# Patient Record
Sex: Male | Born: 1940 | Race: White | Hispanic: No | Marital: Married | Smoking: Never smoker
Health system: Southern US, Community
[De-identification: ages and names within clinical notes are randomized; demographics above are authoritative.]

## PROBLEM LIST (undated history)

## (undated) DIAGNOSIS — T8859XA Other complications of anesthesia, initial encounter: Secondary | ICD-10-CM

## (undated) DIAGNOSIS — I10 Essential (primary) hypertension: Secondary | ICD-10-CM

## (undated) DIAGNOSIS — E119 Type 2 diabetes mellitus without complications: Secondary | ICD-10-CM

---

## 2007-01-18 ENCOUNTER — Ambulatory Visit (HOSPITAL_COMMUNITY): Admission: RE | Admit: 2007-01-18 | Discharge: 2007-01-18 | Payer: Self-pay | Admitting: Pulmonary Disease

## 2011-05-01 DIAGNOSIS — IMO0001 Reserved for inherently not codable concepts without codable children: Secondary | ICD-10-CM | POA: Diagnosis not present

## 2011-05-01 DIAGNOSIS — I10 Essential (primary) hypertension: Secondary | ICD-10-CM | POA: Diagnosis not present

## 2011-10-30 DIAGNOSIS — I10 Essential (primary) hypertension: Secondary | ICD-10-CM | POA: Diagnosis not present

## 2011-10-30 DIAGNOSIS — E109 Type 1 diabetes mellitus without complications: Secondary | ICD-10-CM | POA: Diagnosis not present

## 2011-10-30 DIAGNOSIS — E785 Hyperlipidemia, unspecified: Secondary | ICD-10-CM | POA: Diagnosis not present

## 2011-10-31 DIAGNOSIS — I10 Essential (primary) hypertension: Secondary | ICD-10-CM | POA: Diagnosis not present

## 2011-10-31 DIAGNOSIS — E785 Hyperlipidemia, unspecified: Secondary | ICD-10-CM | POA: Diagnosis not present

## 2011-10-31 DIAGNOSIS — E109 Type 1 diabetes mellitus without complications: Secondary | ICD-10-CM | POA: Diagnosis not present

## 2011-12-26 DIAGNOSIS — H40039 Anatomical narrow angle, unspecified eye: Secondary | ICD-10-CM | POA: Diagnosis not present

## 2011-12-26 DIAGNOSIS — E119 Type 2 diabetes mellitus without complications: Secondary | ICD-10-CM | POA: Diagnosis not present

## 2011-12-26 DIAGNOSIS — H2510 Age-related nuclear cataract, unspecified eye: Secondary | ICD-10-CM | POA: Diagnosis not present

## 2012-01-14 DIAGNOSIS — H40039 Anatomical narrow angle, unspecified eye: Secondary | ICD-10-CM | POA: Diagnosis not present

## 2012-01-27 DIAGNOSIS — H40039 Anatomical narrow angle, unspecified eye: Secondary | ICD-10-CM | POA: Diagnosis not present

## 2012-01-27 DIAGNOSIS — H18419 Arcus senilis, unspecified eye: Secondary | ICD-10-CM | POA: Diagnosis not present

## 2012-01-27 DIAGNOSIS — H2510 Age-related nuclear cataract, unspecified eye: Secondary | ICD-10-CM | POA: Diagnosis not present

## 2012-02-06 DIAGNOSIS — H40039 Anatomical narrow angle, unspecified eye: Secondary | ICD-10-CM | POA: Diagnosis not present

## 2012-02-06 DIAGNOSIS — H402290 Chronic angle-closure glaucoma, unspecified eye, stage unspecified: Secondary | ICD-10-CM | POA: Diagnosis not present

## 2012-02-13 DIAGNOSIS — H40039 Anatomical narrow angle, unspecified eye: Secondary | ICD-10-CM | POA: Diagnosis not present

## 2012-02-20 DIAGNOSIS — H40039 Anatomical narrow angle, unspecified eye: Secondary | ICD-10-CM | POA: Diagnosis not present

## 2012-03-10 DIAGNOSIS — H4020X0 Unspecified primary angle-closure glaucoma, stage unspecified: Secondary | ICD-10-CM | POA: Diagnosis not present

## 2012-03-19 DIAGNOSIS — H402290 Chronic angle-closure glaucoma, unspecified eye, stage unspecified: Secondary | ICD-10-CM | POA: Diagnosis not present

## 2012-04-28 DIAGNOSIS — I10 Essential (primary) hypertension: Secondary | ICD-10-CM | POA: Diagnosis not present

## 2012-04-28 DIAGNOSIS — E109 Type 1 diabetes mellitus without complications: Secondary | ICD-10-CM | POA: Diagnosis not present

## 2012-06-11 DIAGNOSIS — H40039 Anatomical narrow angle, unspecified eye: Secondary | ICD-10-CM | POA: Diagnosis not present

## 2012-09-15 DIAGNOSIS — H40039 Anatomical narrow angle, unspecified eye: Secondary | ICD-10-CM | POA: Diagnosis not present

## 2012-10-29 DIAGNOSIS — I10 Essential (primary) hypertension: Secondary | ICD-10-CM | POA: Diagnosis not present

## 2012-10-29 DIAGNOSIS — M79609 Pain in unspecified limb: Secondary | ICD-10-CM | POA: Diagnosis not present

## 2012-10-29 DIAGNOSIS — E109 Type 1 diabetes mellitus without complications: Secondary | ICD-10-CM | POA: Diagnosis not present

## 2012-11-01 DIAGNOSIS — Z125 Encounter for screening for malignant neoplasm of prostate: Secondary | ICD-10-CM | POA: Diagnosis not present

## 2012-11-01 DIAGNOSIS — I10 Essential (primary) hypertension: Secondary | ICD-10-CM | POA: Diagnosis not present

## 2012-11-01 DIAGNOSIS — E109 Type 1 diabetes mellitus without complications: Secondary | ICD-10-CM | POA: Diagnosis not present

## 2012-12-17 DIAGNOSIS — H402290 Chronic angle-closure glaucoma, unspecified eye, stage unspecified: Secondary | ICD-10-CM | POA: Diagnosis not present

## 2013-04-15 DIAGNOSIS — H2510 Age-related nuclear cataract, unspecified eye: Secondary | ICD-10-CM | POA: Diagnosis not present

## 2013-04-15 DIAGNOSIS — H402290 Chronic angle-closure glaucoma, unspecified eye, stage unspecified: Secondary | ICD-10-CM | POA: Diagnosis not present

## 2013-04-28 DIAGNOSIS — I10 Essential (primary) hypertension: Secondary | ICD-10-CM | POA: Diagnosis not present

## 2013-04-28 DIAGNOSIS — E109 Type 1 diabetes mellitus without complications: Secondary | ICD-10-CM | POA: Diagnosis not present

## 2013-06-28 DIAGNOSIS — T24009A Burn of unspecified degree of unspecified site of unspecified lower limb, except ankle and foot, initial encounter: Secondary | ICD-10-CM | POA: Diagnosis not present

## 2013-10-27 DIAGNOSIS — H402290 Chronic angle-closure glaucoma, unspecified eye, stage unspecified: Secondary | ICD-10-CM | POA: Diagnosis not present

## 2013-10-27 DIAGNOSIS — I10 Essential (primary) hypertension: Secondary | ICD-10-CM | POA: Diagnosis not present

## 2013-10-27 DIAGNOSIS — E119 Type 2 diabetes mellitus without complications: Secondary | ICD-10-CM | POA: Diagnosis not present

## 2013-11-02 DIAGNOSIS — E119 Type 2 diabetes mellitus without complications: Secondary | ICD-10-CM | POA: Diagnosis not present

## 2013-11-02 DIAGNOSIS — I10 Essential (primary) hypertension: Secondary | ICD-10-CM | POA: Diagnosis not present

## 2013-11-02 DIAGNOSIS — Z125 Encounter for screening for malignant neoplasm of prostate: Secondary | ICD-10-CM | POA: Diagnosis not present

## 2014-03-16 DIAGNOSIS — H1045 Other chronic allergic conjunctivitis: Secondary | ICD-10-CM | POA: Diagnosis not present

## 2014-04-26 DIAGNOSIS — D210 Benign neoplasm of connective and other soft tissue of head, face and neck: Secondary | ICD-10-CM | POA: Diagnosis not present

## 2014-04-26 DIAGNOSIS — H40033 Anatomical narrow angle, bilateral: Secondary | ICD-10-CM | POA: Diagnosis not present

## 2014-04-27 DIAGNOSIS — I10 Essential (primary) hypertension: Secondary | ICD-10-CM | POA: Diagnosis not present

## 2014-04-27 DIAGNOSIS — E119 Type 2 diabetes mellitus without complications: Secondary | ICD-10-CM | POA: Diagnosis not present

## 2014-10-25 DIAGNOSIS — H40033 Anatomical narrow angle, bilateral: Secondary | ICD-10-CM | POA: Diagnosis not present

## 2014-10-26 DIAGNOSIS — E119 Type 2 diabetes mellitus without complications: Secondary | ICD-10-CM | POA: Diagnosis not present

## 2014-10-26 DIAGNOSIS — I10 Essential (primary) hypertension: Secondary | ICD-10-CM | POA: Diagnosis not present

## 2014-10-27 DIAGNOSIS — E119 Type 2 diabetes mellitus without complications: Secondary | ICD-10-CM | POA: Diagnosis not present

## 2014-10-27 DIAGNOSIS — Z125 Encounter for screening for malignant neoplasm of prostate: Secondary | ICD-10-CM | POA: Diagnosis not present

## 2014-10-27 DIAGNOSIS — I10 Essential (primary) hypertension: Secondary | ICD-10-CM | POA: Diagnosis not present

## 2015-04-26 DIAGNOSIS — E119 Type 2 diabetes mellitus without complications: Secondary | ICD-10-CM | POA: Diagnosis not present

## 2015-04-26 DIAGNOSIS — I10 Essential (primary) hypertension: Secondary | ICD-10-CM | POA: Diagnosis not present

## 2015-05-08 DIAGNOSIS — H40033 Anatomical narrow angle, bilateral: Secondary | ICD-10-CM | POA: Diagnosis not present

## 2015-08-18 DIAGNOSIS — S0501XA Injury of conjunctiva and corneal abrasion without foreign body, right eye, initial encounter: Secondary | ICD-10-CM | POA: Diagnosis not present

## 2015-08-20 DIAGNOSIS — S0501XS Injury of conjunctiva and corneal abrasion without foreign body, right eye, sequela: Secondary | ICD-10-CM | POA: Diagnosis not present

## 2015-10-03 ENCOUNTER — Other Ambulatory Visit (HOSPITAL_COMMUNITY): Payer: Self-pay | Admitting: Pulmonary Disease

## 2015-10-03 ENCOUNTER — Ambulatory Visit (HOSPITAL_COMMUNITY)
Admission: RE | Admit: 2015-10-03 | Discharge: 2015-10-03 | Disposition: A | Discharge status: Home / Self Care | Payer: Medicare Other | Source: Ambulatory Visit | Attending: Pulmonary Disease | Admitting: Pulmonary Disease

## 2015-10-03 DIAGNOSIS — R509 Fever, unspecified: Secondary | ICD-10-CM | POA: Diagnosis not present

## 2015-10-03 DIAGNOSIS — I10 Essential (primary) hypertension: Secondary | ICD-10-CM | POA: Diagnosis not present

## 2015-10-03 DIAGNOSIS — J189 Pneumonia, unspecified organism: Secondary | ICD-10-CM | POA: Diagnosis not present

## 2015-10-03 DIAGNOSIS — R634 Abnormal weight loss: Secondary | ICD-10-CM | POA: Diagnosis not present

## 2015-10-03 DIAGNOSIS — E119 Type 2 diabetes mellitus without complications: Secondary | ICD-10-CM | POA: Diagnosis not present

## 2015-10-03 DIAGNOSIS — R05 Cough: Secondary | ICD-10-CM | POA: Diagnosis not present

## 2015-10-03 DIAGNOSIS — N390 Urinary tract infection, site not specified: Secondary | ICD-10-CM | POA: Diagnosis not present

## 2015-10-26 DIAGNOSIS — R509 Fever, unspecified: Secondary | ICD-10-CM | POA: Diagnosis not present

## 2015-10-26 DIAGNOSIS — R634 Abnormal weight loss: Secondary | ICD-10-CM | POA: Diagnosis not present

## 2015-10-26 DIAGNOSIS — E119 Type 2 diabetes mellitus without complications: Secondary | ICD-10-CM | POA: Diagnosis not present

## 2015-10-26 DIAGNOSIS — Z125 Encounter for screening for malignant neoplasm of prostate: Secondary | ICD-10-CM | POA: Diagnosis not present

## 2015-10-26 DIAGNOSIS — I10 Essential (primary) hypertension: Secondary | ICD-10-CM | POA: Diagnosis not present

## 2015-10-26 DIAGNOSIS — M6281 Muscle weakness (generalized): Secondary | ICD-10-CM | POA: Diagnosis not present

## 2015-11-08 DIAGNOSIS — H40031 Anatomical narrow angle, right eye: Secondary | ICD-10-CM | POA: Diagnosis not present

## 2015-11-08 DIAGNOSIS — H40032 Anatomical narrow angle, left eye: Secondary | ICD-10-CM | POA: Diagnosis not present

## 2016-04-28 DIAGNOSIS — E119 Type 2 diabetes mellitus without complications: Secondary | ICD-10-CM | POA: Diagnosis not present

## 2016-04-28 DIAGNOSIS — D649 Anemia, unspecified: Secondary | ICD-10-CM | POA: Diagnosis not present

## 2016-04-28 DIAGNOSIS — I10 Essential (primary) hypertension: Secondary | ICD-10-CM | POA: Diagnosis not present

## 2016-07-21 DIAGNOSIS — H11153 Pinguecula, bilateral: Secondary | ICD-10-CM | POA: Diagnosis not present

## 2016-10-27 DIAGNOSIS — R634 Abnormal weight loss: Secondary | ICD-10-CM | POA: Diagnosis not present

## 2016-10-27 DIAGNOSIS — D649 Anemia, unspecified: Secondary | ICD-10-CM | POA: Diagnosis not present

## 2016-10-27 DIAGNOSIS — E1165 Type 2 diabetes mellitus with hyperglycemia: Secondary | ICD-10-CM | POA: Diagnosis not present

## 2016-10-27 DIAGNOSIS — Z125 Encounter for screening for malignant neoplasm of prostate: Secondary | ICD-10-CM | POA: Diagnosis not present

## 2016-10-27 DIAGNOSIS — I10 Essential (primary) hypertension: Secondary | ICD-10-CM | POA: Diagnosis not present

## 2016-10-27 DIAGNOSIS — Z0000 Encounter for general adult medical examination without abnormal findings: Secondary | ICD-10-CM | POA: Diagnosis not present

## 2016-10-27 DIAGNOSIS — M6281 Muscle weakness (generalized): Secondary | ICD-10-CM | POA: Diagnosis not present

## 2016-10-29 DIAGNOSIS — I10 Essential (primary) hypertension: Secondary | ICD-10-CM | POA: Diagnosis not present

## 2016-10-29 DIAGNOSIS — E119 Type 2 diabetes mellitus without complications: Secondary | ICD-10-CM | POA: Diagnosis not present

## 2016-11-14 DIAGNOSIS — H40033 Anatomical narrow angle, bilateral: Secondary | ICD-10-CM | POA: Diagnosis not present

## 2017-02-17 DIAGNOSIS — H40033 Anatomical narrow angle, bilateral: Secondary | ICD-10-CM | POA: Diagnosis not present

## 2017-04-27 DIAGNOSIS — I10 Essential (primary) hypertension: Secondary | ICD-10-CM | POA: Diagnosis not present

## 2017-04-27 DIAGNOSIS — E119 Type 2 diabetes mellitus without complications: Secondary | ICD-10-CM | POA: Diagnosis not present

## 2017-06-18 DIAGNOSIS — H40033 Anatomical narrow angle, bilateral: Secondary | ICD-10-CM | POA: Diagnosis not present

## 2017-10-22 DIAGNOSIS — D649 Anemia, unspecified: Secondary | ICD-10-CM | POA: Diagnosis not present

## 2017-10-22 DIAGNOSIS — Z0000 Encounter for general adult medical examination without abnormal findings: Secondary | ICD-10-CM | POA: Diagnosis not present

## 2017-10-22 DIAGNOSIS — M6281 Muscle weakness (generalized): Secondary | ICD-10-CM | POA: Diagnosis not present

## 2017-10-22 DIAGNOSIS — I10 Essential (primary) hypertension: Secondary | ICD-10-CM | POA: Diagnosis not present

## 2017-10-22 DIAGNOSIS — R634 Abnormal weight loss: Secondary | ICD-10-CM | POA: Diagnosis not present

## 2017-10-22 DIAGNOSIS — E1165 Type 2 diabetes mellitus with hyperglycemia: Secondary | ICD-10-CM | POA: Diagnosis not present

## 2017-10-22 DIAGNOSIS — Z125 Encounter for screening for malignant neoplasm of prostate: Secondary | ICD-10-CM | POA: Diagnosis not present

## 2017-11-05 DIAGNOSIS — Z0000 Encounter for general adult medical examination without abnormal findings: Secondary | ICD-10-CM | POA: Diagnosis not present

## 2017-12-18 DIAGNOSIS — H40033 Anatomical narrow angle, bilateral: Secondary | ICD-10-CM | POA: Diagnosis not present

## 2018-06-25 DIAGNOSIS — H40033 Anatomical narrow angle, bilateral: Secondary | ICD-10-CM | POA: Diagnosis not present

## 2018-06-25 DIAGNOSIS — H2513 Age-related nuclear cataract, bilateral: Secondary | ICD-10-CM | POA: Diagnosis not present

## 2019-02-18 ENCOUNTER — Ambulatory Visit (INDEPENDENT_AMBULATORY_CARE_PROVIDER_SITE_OTHER): Payer: Medicare Other

## 2019-02-18 ENCOUNTER — Ambulatory Visit
Admission: EM | Admit: 2019-02-18 | Discharge: 2019-02-18 | Disposition: A | Discharge status: Home / Self Care | Payer: Medicare Other | Attending: Emergency Medicine | Admitting: Emergency Medicine

## 2019-02-18 DIAGNOSIS — S92351A Displaced fracture of fifth metatarsal bone, right foot, initial encounter for closed fracture: Secondary | ICD-10-CM

## 2019-02-18 DIAGNOSIS — S92355A Nondisplaced fracture of fifth metatarsal bone, left foot, initial encounter for closed fracture: Secondary | ICD-10-CM | POA: Diagnosis not present

## 2019-02-18 HISTORY — DX: Essential (primary) hypertension: I10

## 2019-02-18 MED ORDER — TRAMADOL HCL 50 MG PO TABS: 50.0000 mg | ORAL_TABLET | Freq: Two times a day (BID) | ORAL | 0 refills | Status: DC | PRN

## 2019-02-18 NOTE — ED Provider Notes (Signed)
[name]   MW:[id number] 02/18/19 Arrival Time: 1209  CC: RT foot pain  SUBJECTIVE: History from: patient. Sean Mcfarland is a 79 y.o. male complains of right foot pain that began 1 day ago.  Symptoms began after having a fall when he got out of bed.  Localizes the pain to the top and outside of foot.  Describes the pain as intermittent and achy in character.  Has tried OTC medications without relief.  Symptoms are made worse with walking.  Denies similar symptoms in the past.  Complains of associated swelling.  Denies fever, chills, erythema, ecchymosis, weakness, numbness and tingling.   ROS: As per HPI.  All other pertinent ROS negative.     Past Medical History:  Diagnosis Date  . Hypertension    History reviewed. No pertinent surgical history. No Known Allergies No current facility-administered medications on file prior to encounter.   No current outpatient medications on file prior to encounter.   Social History   Socioeconomic History  . Marital status: Married    Spouse name: Not on file  . Number of children: Not on file  . Years of education: Not on file  . Highest education level: Not on file  Occupational History  . Not on file  Tobacco Use  . Smoking status: Never Smoker  . Smokeless tobacco: Never Used  Substance and Sexual Activity  . Alcohol use: Never  . Drug use: Not on file  . Sexual activity: Not on file  Other Topics Concern  . Not on file  Social History Narrative  . Not on file   Social Determinants of Health   Financial Resource Strain:   . Difficulty of Paying Living Expenses: Not on file  Food Insecurity:   . Worried About Charity fundraiser in the Last Year: Not on file  . Ran Out of Food in the Last Year: Not on file  Transportation Needs:   . Lack of Transportation (Medical): Not on file  . Lack of Transportation (Non-Medical): Not on file  Physical Activity:   . Days of Exercise per Week: Not on file  . Minutes of  Exercise per Session: Not on file  Stress:   . Feeling of Stress : Not on file  Social Connections:   . Frequency of Communication with Friends and Family: Not on file  . Frequency of Social Gatherings with Friends and Family: Not on file  . Attends Religious Services: Not on file  . Active Member of Clubs or Organizations: Not on file  . Attends Archivist Meetings: Not on file  . Marital Status: Not on file  Intimate Partner Violence:   . Fear of Current or Ex-Partner: Not on file  . Emotionally Abused: Not on file  . Physically Abused: Not on file  . Sexually Abused: Not on file   Family History  Problem Relation Age of Onset  . Healthy Mother   . Healthy Father     OBJECTIVE:  Vitals:   02/18/19 1317  BP: 133/77  Pulse: 68  Resp: 17  Temp: 98.3 F (36.8 C)  TempSrc: Oral  SpO2: 98%    General appearance: ALERT; in no acute distress.  Head: NCAT Lungs: Normal respiratory effort CV: posterior tibialis pulse 2+. Cap refill < 2 seconds Musculoskeletal: RT foot Inspection: Swelling diffuse about plantar aspect of foot Palpation: TTP over lateral fifth MT ROM: FROM active and passive Strength: 5/5 dorsiflexion, 5/5 plantar flexion Skin: warm and dry Neurologic:  Ambulates with minimal difficulty; Sensation intact about the lower extremities Psychological: alert and cooperative; normal mood and affect  DIAGNOSTIC STUDIES:  DG Foot Complete Right  Result Date: 02/18/2019 CLINICAL DATA:  79 year old male status post fall yesterday with continued lateral foot pain and swelling. EXAM: RIGHT FOOT COMPLETE - 3+ VIEW COMPARISON:  None. FINDINGS: Mild pes planus. Tarsal bones appear intact and normally aligned. Accessory ossicle adjacent to the cuboid. Oblique fracture through the distal 5th metatarsal shaft with mild impaction and medial displacement. This appears extra-articular. The 5th MTP remains aligned. Other metatarsals and phalanges appear intact. IMPRESSION:  Oblique fracture through the distal 5th metatarsal shaft with mild impaction and medial displacement. Electronically Signed   By: Genevie Ann M.D.   On: 02/18/2019 13:42    X-rays positive for 5th MT fracture.  I have reviewed the x-rays myself and the radiologist interpretation. I am in agreement with the radiologist interpretation.    ASSESSMENT & PLAN:  1. Nondisplaced fracture of fifth metatarsal bone, left foot, initial encounter for closed fracture     Meds ordered this encounter  Medications  . traMADol (ULTRAM) 50 MG tablet    Sig: Take 1 tablet (50 mg total) by mouth every 12 (twelve) hours as needed.    Dispense:  10 tablet    Refill:  0    Order Specific Question:   Supervising Provider    Answer:   Raylene Everts [id number]    Continue conservative management of rest, ice, and elevation Declines post-op shoe and crutches Walking boot placed Alternate ibuprofen and/or tylenol for pain and swelling Tramadol for severe break-through pain. DO NOT TAKE prior to driving or operating heavy machinery Follow up with orthopedist for further evaluation and managment Return or go to the ER if you have any new or worsening symptoms (fever, chills, chest pain, redness, swelling, bruising, worsening symptoms despite treatment, etc...)   Reviewed expectations re: course of current medical issues. Questions answered. Outlined signs and symptoms indicating need for more acute intervention. Patient verbalized understanding. After Visit Summary given.    Lestine Box, PA-C 02/18/19 1400

## 2019-02-18 NOTE — Discharge Instructions (Addendum)
Continue conservative management of rest, ice, and elevation Declines post-op shoe and crutches Walking boot placed Alternate ibuprofen and/or tylenol for pain and swelling Tramadol for severe break-through pain. DO NOT TAKE prior to driving or operating heavy machinery Follow up with orthopedist for further evaluation and managment Return or go to the ER if you have any new or worsening symptoms (fever, chills, chest pain, redness, swelling, bruising, worsening symptoms despite treatment, etc...)

## 2019-02-18 NOTE — ED Triage Notes (Signed)
Pt states he fell yesterday and has been having pain and swelling in his right foot. CMS intact. Pt is ambulatory.

## 2019-02-21 ENCOUNTER — Ambulatory Visit (INDEPENDENT_AMBULATORY_CARE_PROVIDER_SITE_OTHER): Payer: Medicare Other | Admitting: Orthopedic Surgery

## 2019-02-21 ENCOUNTER — Other Ambulatory Visit: Payer: Self-pay

## 2019-02-21 ENCOUNTER — Telehealth: Payer: Self-pay

## 2019-02-21 ENCOUNTER — Ambulatory Visit: Payer: Medicare Other | Admitting: Orthopedic Surgery

## 2019-02-21 VITALS — BP 146/89 | HR 70 | Temp 97.0°F | Ht 65.0 in

## 2019-02-21 DIAGNOSIS — S92351A Displaced fracture of fifth metatarsal bone, right foot, initial encounter for closed fracture: Secondary | ICD-10-CM

## 2019-02-21 NOTE — Telephone Encounter (Signed)
He is here being seen by Dr. Aline Brochure as we speak.

## 2019-02-21 NOTE — Telephone Encounter (Signed)
Patient's son-in-law Windell [place]) called wanting to get his father-in-law in to see Dr. Aline Brochure today. He was not in a very pleasant mood, had already contacted [place] office and was referred over to [place] office. Patient is scheduled to see Dr. Marlou Sa this afternoon at 2:15 pm. Mr. Silverio Lay was upset first because [facility] had listed [place] office first, so he called there first instead of calling our office and then second he had to wait on hold for a few minutes since I was on another line.  I explained to him that Dr. Aline Brochure is only here this morning and that I was sorry he called the [place] office first, but that [place] and our office work together. He stated that if he had to go back and forth to [facility] he would be switching his father-in-law's care to our office, because it was not convenient for him. I apologized several times to him for all of the inconvenience he went through.

## 2019-02-21 NOTE — Telephone Encounter (Signed)
Thank you for speaking with him.  Do we need to do anything at this point?  Or is he going to be seeing Dr Marlou Sa today?

## 2019-02-21 NOTE — Progress Notes (Signed)
Chief Complaint  Patient presents with  . Foot Injury    Right foot fracture, DOI 02-17-19.    Right foot pain.  79 year old male says he got up to go to the bathroom and injured his right foot presented to the ER on 8 January with a date of injury of January 7 complaining of dorsal lateral pain of the right foot.  His ER records were reviewed and indicate that he has a right fifth metatarsal comminuted fracture near the distal metatarsal diaphyseal junction.  He was treated with a cam walker.  History of pain is controlled fairly well with tramadol and a cam walker    Review of Systems  Constitutional: Negative for chills and fever.  Skin: Negative.   Neurological: Negative for tingling.   Past Medical History:  Diagnosis Date  . Hypertension     BP (!) 146/89   Pulse 70   Temp (!) 97 F (36.1 C)   Ht 5\' 5"  (1.651 m)   He is awake alert and oriented x3 his mood and affect are normal His general appearance is normal his body frame is medium  His right foot is tender on the dorsal lateral aspect near the distal aspect with mild swelling skin is intact he can move his toes normally has no foot atrophy  He is ambulating with a walker boot  Outside x-ray interpretation AP lateral and oblique right foot Comminuted fracture distal fifth metatarsal near the metaphyseal diaphyseal junction  Recommended treatment cam walker  Weight-bear as tolerated 6 weeks  Recommend he continue his 10 mg hydrocodone daily which she takes for chronic pain  Encounter Diagnosis  Name Primary?  . Closed fracture of fifth metatarsal bone of right foot, initial encounter Yes   Summation acute uncomplicated injury assessment requiring independent interpretation of x-ray outside film

## 2019-03-16 ENCOUNTER — Ambulatory Visit: Payer: Medicare Other | Admitting: Family Medicine

## 2019-04-04 ENCOUNTER — Other Ambulatory Visit: Payer: Self-pay

## 2019-04-04 ENCOUNTER — Ambulatory Visit: Payer: Medicare Other

## 2019-04-04 ENCOUNTER — Ambulatory Visit (INDEPENDENT_AMBULATORY_CARE_PROVIDER_SITE_OTHER): Payer: Medicare Other | Admitting: Orthopedic Surgery

## 2019-04-04 DIAGNOSIS — S92351D Displaced fracture of fifth metatarsal bone, right foot, subsequent encounter for fracture with routine healing: Secondary | ICD-10-CM

## 2019-04-04 NOTE — Patient Instructions (Addendum)
Walk in the house with the boot off, if it doesn't hurt you can remove the boot for good.  If it hurts wear boot 2 more weeks

## 2019-04-04 NOTE — Progress Notes (Signed)
Chief Complaint  Patient presents with  . Foot Pain    R/ doing ok   Recheck fracture fifth metatarsal  Right foot pain.  79 year old male says he got up to go to the bathroom and injured his right foot presented to the ER on 8 January with a date of injury of January 7 complaining of dorsal lateral pain of the right foot.  His ER records were reviewed and indicate that he has a right fifth metatarsal comminuted fracture near the distal metatarsal diaphyseal junction.  He was treated with a cam walker.  History of pain is controlled fairly well with tramadol and a cam walker   X-ray shows no displacement but on the oblique we don't see any callus  Clinical exam is normal  Walk in the house with the boot off, if it doesn't hurt you can remove the boot for good.  If it hurts wear boot 2 more weeks   Encounter Diagnosis  Name Primary?  . Closed fracture of fifth metatarsal bone of right foot with routine healing, subsequent encounter 02/17/19 Yes

## 2019-04-13 ENCOUNTER — Telehealth: Payer: Self-pay | Admitting: Orthopedic Surgery

## 2019-04-13 NOTE — Telephone Encounter (Signed)
Says he has been walking without the boot and is feeling good.  He will call us back if he has any pain, swelling or problems

## 2019-05-30 DIAGNOSIS — I10 Essential (primary) hypertension: Secondary | ICD-10-CM | POA: Diagnosis not present

## 2019-05-30 DIAGNOSIS — Z0189 Encounter for other specified special examinations: Secondary | ICD-10-CM | POA: Diagnosis not present

## 2019-05-30 DIAGNOSIS — E1165 Type 2 diabetes mellitus with hyperglycemia: Secondary | ICD-10-CM | POA: Diagnosis not present

## 2019-06-15 DIAGNOSIS — Z0189 Encounter for other specified special examinations: Secondary | ICD-10-CM | POA: Diagnosis not present

## 2019-06-15 DIAGNOSIS — Z712 Person consulting for explanation of examination or test findings: Secondary | ICD-10-CM | POA: Diagnosis not present

## 2019-06-15 DIAGNOSIS — E1165 Type 2 diabetes mellitus with hyperglycemia: Secondary | ICD-10-CM | POA: Diagnosis not present

## 2019-06-15 DIAGNOSIS — I10 Essential (primary) hypertension: Secondary | ICD-10-CM | POA: Diagnosis not present

## 2019-06-21 DIAGNOSIS — E1165 Type 2 diabetes mellitus with hyperglycemia: Secondary | ICD-10-CM | POA: Diagnosis not present

## 2019-06-21 DIAGNOSIS — I10 Essential (primary) hypertension: Secondary | ICD-10-CM | POA: Diagnosis not present

## 2019-09-30 DIAGNOSIS — I10 Essential (primary) hypertension: Secondary | ICD-10-CM | POA: Diagnosis not present

## 2019-09-30 DIAGNOSIS — Z712 Person consulting for explanation of examination or test findings: Secondary | ICD-10-CM | POA: Diagnosis not present

## 2019-09-30 DIAGNOSIS — Z0189 Encounter for other specified special examinations: Secondary | ICD-10-CM | POA: Diagnosis not present

## 2019-09-30 DIAGNOSIS — R7301 Impaired fasting glucose: Secondary | ICD-10-CM | POA: Diagnosis not present

## 2019-10-04 ENCOUNTER — Other Ambulatory Visit (HOSPITAL_COMMUNITY): Payer: Self-pay | Admitting: Internal Medicine

## 2019-10-04 ENCOUNTER — Other Ambulatory Visit: Payer: Self-pay | Admitting: Internal Medicine

## 2019-10-04 DIAGNOSIS — R945 Abnormal results of liver function studies: Secondary | ICD-10-CM

## 2019-10-04 DIAGNOSIS — I10 Essential (primary) hypertension: Secondary | ICD-10-CM | POA: Diagnosis not present

## 2019-10-04 DIAGNOSIS — E1165 Type 2 diabetes mellitus with hyperglycemia: Secondary | ICD-10-CM | POA: Diagnosis not present

## 2019-10-04 DIAGNOSIS — E785 Hyperlipidemia, unspecified: Secondary | ICD-10-CM | POA: Diagnosis not present

## 2019-10-11 ENCOUNTER — Ambulatory Visit (HOSPITAL_COMMUNITY)
Admission: RE | Admit: 2019-10-11 | Discharge: 2019-10-11 | Disposition: A | Discharge status: Home / Self Care | Payer: Medicare Other | Source: Ambulatory Visit | Attending: Internal Medicine | Admitting: Internal Medicine

## 2019-10-11 ENCOUNTER — Other Ambulatory Visit: Payer: Self-pay

## 2019-10-11 DIAGNOSIS — R945 Abnormal results of liver function studies: Secondary | ICD-10-CM | POA: Insufficient documentation

## 2019-10-11 DIAGNOSIS — K8020 Calculus of gallbladder without cholecystitis without obstruction: Secondary | ICD-10-CM | POA: Diagnosis not present

## 2019-10-11 DIAGNOSIS — N281 Cyst of kidney, acquired: Secondary | ICD-10-CM | POA: Diagnosis not present

## 2019-10-11 DIAGNOSIS — I700 Atherosclerosis of aorta: Secondary | ICD-10-CM | POA: Diagnosis not present

## 2019-10-11 DIAGNOSIS — K7689 Other specified diseases of liver: Secondary | ICD-10-CM | POA: Diagnosis not present

## 2019-10-14 ENCOUNTER — Other Ambulatory Visit: Payer: Self-pay | Admitting: Internal Medicine

## 2019-10-14 ENCOUNTER — Other Ambulatory Visit (HOSPITAL_COMMUNITY): Payer: Self-pay | Admitting: Internal Medicine

## 2019-10-14 DIAGNOSIS — K7689 Other specified diseases of liver: Secondary | ICD-10-CM

## 2019-10-28 ENCOUNTER — Ambulatory Visit (HOSPITAL_COMMUNITY)
Admission: RE | Admit: 2019-10-28 | Discharge: 2019-10-28 | Disposition: A | Discharge status: Home / Self Care | Payer: Medicare Other | Source: Ambulatory Visit | Attending: Internal Medicine | Admitting: Internal Medicine

## 2019-10-28 ENCOUNTER — Other Ambulatory Visit: Payer: Self-pay

## 2019-10-28 DIAGNOSIS — N281 Cyst of kidney, acquired: Secondary | ICD-10-CM | POA: Diagnosis not present

## 2019-10-28 DIAGNOSIS — K7689 Other specified diseases of liver: Secondary | ICD-10-CM | POA: Insufficient documentation

## 2019-10-28 MED ORDER — GADOBUTROL 1 MMOL/ML IV SOLN
7.0000 mL | Freq: Once | INTRAVENOUS | Status: AC | PRN
  Administered 2019-10-28: 7 mL via INTRAVENOUS

## 2019-11-04 DIAGNOSIS — Z0189 Encounter for other specified special examinations: Secondary | ICD-10-CM | POA: Diagnosis not present

## 2019-11-04 DIAGNOSIS — Z712 Person consulting for explanation of examination or test findings: Secondary | ICD-10-CM | POA: Diagnosis not present

## 2019-11-04 DIAGNOSIS — R945 Abnormal results of liver function studies: Secondary | ICD-10-CM | POA: Diagnosis not present

## 2019-11-08 DIAGNOSIS — R945 Abnormal results of liver function studies: Secondary | ICD-10-CM | POA: Diagnosis not present

## 2019-11-08 DIAGNOSIS — E785 Hyperlipidemia, unspecified: Secondary | ICD-10-CM | POA: Diagnosis not present

## 2019-11-08 DIAGNOSIS — E1165 Type 2 diabetes mellitus with hyperglycemia: Secondary | ICD-10-CM | POA: Diagnosis not present

## 2019-11-08 DIAGNOSIS — D649 Anemia, unspecified: Secondary | ICD-10-CM | POA: Diagnosis not present

## 2019-11-08 DIAGNOSIS — I10 Essential (primary) hypertension: Secondary | ICD-10-CM | POA: Diagnosis not present

## 2019-11-22 ENCOUNTER — Encounter: Payer: Self-pay | Admitting: Internal Medicine

## 2019-11-29 ENCOUNTER — Ambulatory Visit (INDEPENDENT_AMBULATORY_CARE_PROVIDER_SITE_OTHER): Payer: Medicare Other | Admitting: Gastroenterology

## 2019-11-29 ENCOUNTER — Other Ambulatory Visit: Payer: Self-pay

## 2019-11-29 ENCOUNTER — Encounter: Payer: Self-pay | Admitting: Gastroenterology

## 2019-11-29 DIAGNOSIS — D649 Anemia, unspecified: Secondary | ICD-10-CM | POA: Diagnosis not present

## 2019-11-29 DIAGNOSIS — R932 Abnormal findings on diagnostic imaging of liver and biliary tract: Secondary | ICD-10-CM

## 2019-11-29 DIAGNOSIS — R945 Abnormal results of liver function studies: Secondary | ICD-10-CM | POA: Insufficient documentation

## 2019-11-29 DIAGNOSIS — R7989 Other specified abnormal findings of blood chemistry: Secondary | ICD-10-CM

## 2019-11-29 DIAGNOSIS — Z79899 Other long term (current) drug therapy: Secondary | ICD-10-CM | POA: Diagnosis not present

## 2019-11-29 NOTE — Patient Instructions (Signed)
1. Please go for labs today. We will contact you with results as available. I will also look at the MRI with the radiologist as mentioned.  2. If you develop fever, nausea/vomiting, abdominal pain, please call us.
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
## 2019-11-30 ENCOUNTER — Telehealth: Payer: Self-pay | Admitting: Gastroenterology

## 2019-11-30 LAB — CBC WITH DIFFERENTIAL/PLATELET
Basophils Absolute: 0.1 10*3/uL (ref 0.0–0.2)
Basos: 1 %
EOS (ABSOLUTE): 0.1 10*3/uL (ref 0.0–0.4)
Eos: 1 %
Hematocrit: 33.9 % — ABNORMAL LOW (ref 37.5–51.0)
Hemoglobin: 11.4 g/dL — ABNORMAL LOW (ref 13.0–17.7)
Immature Grans (Abs): 0 10*3/uL (ref 0.0–0.1)
Immature Granulocytes: 0 %
Lymphocytes Absolute: 1.7 10*3/uL (ref 0.7–3.1)
Lymphs: 21 %
MCH: 30.5 pg (ref 26.6–33.0)
MCHC: 33.6 g/dL (ref 31.5–35.7)
MCV: 91 fL (ref 79–97)
Monocytes Absolute: 0.6 10*3/uL (ref 0.1–0.9)
Monocytes: 8 %
Neutrophils Absolute: 5.5 10*3/uL (ref 1.4–7.0)
Neutrophils: 69 %
Platelets: 366 10*3/uL (ref 150–450)
RBC: 3.74 x10E6/uL — ABNORMAL LOW (ref 4.14–5.80)
RDW: 12.4 % (ref 11.6–15.4)
WBC: 8 10*3/uL (ref 3.4–10.8)

## 2019-11-30 LAB — IRON,TIBC AND FERRITIN PANEL
Ferritin: 980 ng/mL — ABNORMAL HIGH (ref 30–400)
Iron Saturation: 47 % (ref 15–55)
Iron: 82 ug/dL (ref 38–169)
Total Iron Binding Capacity: 175 ug/dL — ABNORMAL LOW (ref 250–450)
UIBC: 93 ug/dL — ABNORMAL LOW (ref 111–343)

## 2019-11-30 LAB — COMPREHENSIVE METABOLIC PANEL
ALT: 287 IU/L — ABNORMAL HIGH (ref 0–44)
AST: 282 IU/L — ABNORMAL HIGH (ref 0–40)
Albumin/Globulin Ratio: 1.2 (ref 1.2–2.2)
Albumin: 3.2 g/dL — ABNORMAL LOW (ref 3.7–4.7)
Alkaline Phosphatase: 1385 IU/L (ref 44–121)
BUN/Creatinine Ratio: 27 — ABNORMAL HIGH (ref 10–24)
BUN: 17 mg/dL (ref 8–27)
Bilirubin Total: 6.8 mg/dL — ABNORMAL HIGH (ref 0.0–1.2)
CO2: 24 mmol/L (ref 20–29)
Calcium: 8.5 mg/dL — ABNORMAL LOW (ref 8.6–10.2)
Chloride: 101 mmol/L (ref 96–106)
Creatinine, Ser: 0.64 mg/dL — ABNORMAL LOW (ref 0.76–1.27)
GFR calc Af Amer: 107 mL/min/{1.73_m2} (ref >59)
GFR calc non Af Amer: 92 mL/min/{1.73_m2} (ref >59)
Globulin, Total: 2.7 g/dL (ref 1.5–4.5)
Glucose: 142 mg/dL — ABNORMAL HIGH (ref 65–99)
Potassium: 3.9 mmol/L (ref 3.5–5.2)
Sodium: 137 mmol/L (ref 134–144)
Total Protein: 5.9 g/dL — ABNORMAL LOW (ref 6.0–8.5)

## 2019-11-30 LAB — MITOCHONDRIAL/SMOOTH MUSCLE AB PNL
Mitochondrial Ab: <20 Units (ref 0.0–20.0)
Smooth Muscle Ab: 8 Units (ref 0–19)

## 2019-11-30 LAB — HEPATITIS C ANTIBODY: Hep C Virus Ab: <0.1 s/co ratio (ref 0.0–0.9)

## 2019-11-30 LAB — HEPATITIS B SURFACE ANTIGEN: Hepatitis B Surface Ag: NEGATIVE

## 2019-11-30 LAB — IGG, IGA, IGM
IgA/Immunoglobulin A, Serum: 500 mg/dL — ABNORMAL HIGH (ref 61–437)
IgG (Immunoglobin G), Serum: 1040 mg/dL (ref 603–1613)
IgM (Immunoglobulin M), Srm: 79 mg/dL (ref 15–143)

## 2019-11-30 LAB — AFP TUMOR MARKER: AFP, Serum, Tumor Marker: 2 ng/mL (ref 0.0–8.3)

## 2019-11-30 LAB — HEPATITIS A ANTIBODY, TOTAL: hep A Total Ab: NEGATIVE

## 2019-11-30 LAB — ANA: Anti Nuclear Antibody (ANA): POSITIVE — AB

## 2019-11-30 LAB — HEPATITIS B SURFACE ANTIBODY,QUALITATIVE: Hep B Surface Ab, Qual: NONREACTIVE

## 2019-11-30 NOTE — Telephone Encounter (Signed)
Spoke to Dr. Laural Golden and patient today. His LFTs remain significantly elevated with bilirubin over 6, AP over 1300, AST/ALT in the 200 range. Radiologist looked at MRI Abd done few weeks ago and states the CBD is dilated and has oblong filling defect 2cm in length. Patient afebrile. Had first episode of abd pain last night, occurred after eating biscuit from fast food restaurant. No pain this morning. ER precautions provided. Advised low fat diet for now.  Patient is aware that Dr. Olevia Perches office will be calling to schedule ERCP.   FYI Dr. Abbey Chatters.

## 2019-12-01 ENCOUNTER — Encounter (HOSPITAL_COMMUNITY)
Admission: RE | Admit: 2019-12-01 | Discharge: 2019-12-01 | Disposition: A | Discharge status: Home / Self Care | Payer: Medicare Other | Source: Ambulatory Visit | Attending: Internal Medicine | Admitting: Internal Medicine

## 2019-12-01 ENCOUNTER — Encounter (HOSPITAL_COMMUNITY): Payer: Self-pay

## 2019-12-01 ENCOUNTER — Telehealth: Payer: Self-pay | Admitting: Internal Medicine

## 2019-12-01 ENCOUNTER — Other Ambulatory Visit: Payer: Self-pay

## 2019-12-01 ENCOUNTER — Other Ambulatory Visit (HOSPITAL_COMMUNITY)
Admission: RE | Admit: 2019-12-01 | Discharge: 2019-12-01 | Disposition: A | Discharge status: Home / Self Care | Payer: Medicare Other | Source: Ambulatory Visit | Attending: Internal Medicine | Admitting: Internal Medicine

## 2019-12-01 ENCOUNTER — Other Ambulatory Visit (INDEPENDENT_AMBULATORY_CARE_PROVIDER_SITE_OTHER): Payer: Self-pay

## 2019-12-01 DIAGNOSIS — R945 Abnormal results of liver function studies: Secondary | ICD-10-CM

## 2019-12-01 DIAGNOSIS — Z20822 Contact with and (suspected) exposure to covid-19: Secondary | ICD-10-CM | POA: Diagnosis not present

## 2019-12-01 DIAGNOSIS — Z01812 Encounter for preprocedural laboratory examination: Secondary | ICD-10-CM | POA: Diagnosis not present

## 2019-12-01 DIAGNOSIS — R7989 Other specified abnormal findings of blood chemistry: Secondary | ICD-10-CM

## 2019-12-01 HISTORY — DX: Type 2 diabetes mellitus without complications: E11.9

## 2019-12-01 LAB — SARS CORONAVIRUS 2 (TAT 6-24 HRS): SARS Coronavirus 2: NEGATIVE

## 2019-12-01 NOTE — Telephone Encounter (Signed)
Pt was seen in office recently and called this morning saying that Neil Crouch, PA wanted him to call and speak with her. Please advise. [phone]

## 2019-12-01 NOTE — Telephone Encounter (Signed)
Patient sch'd for ERCP tomorrow, arrive at [facility] at 1030, covid test today at 1030 - patient and wife aware

## 2019-12-01 NOTE — Telephone Encounter (Signed)
PCP office called to say that an addendum was done to the MRI and wanted to let us be aware and asked for someone to call PCP office back at [phone] to let them know the plan of care for the patient.

## 2019-12-01 NOTE — Telephone Encounter (Signed)
I think the issue has been resolved. I told him to call this morning if he had not heard from Dr. Olevia Perches about scheduling ERCP but looks like he has now been scheduled for tomorrow. Let me know if he needs anything else.

## 2019-12-01 NOTE — Telephone Encounter (Signed)
Noted  

## 2019-12-01 NOTE — Telephone Encounter (Signed)
Lmom, waiting on a return call.  

## 2019-12-01 NOTE — Telephone Encounter (Signed)
Noted. Thanks.

## 2019-12-01 NOTE — Telephone Encounter (Signed)
Called office, it opens back up at 2 pm. Will call back.

## 2019-12-01 NOTE — Telephone Encounter (Signed)
Sean Mcfarland, pt is returning a call from you this morning.

## 2019-12-02 ENCOUNTER — Ambulatory Visit (HOSPITAL_COMMUNITY): Payer: Medicare Other | Admitting: Anesthesiology

## 2019-12-02 ENCOUNTER — Ambulatory Visit (HOSPITAL_COMMUNITY): Payer: Medicare Other

## 2019-12-02 ENCOUNTER — Ambulatory Visit (HOSPITAL_COMMUNITY)
Admission: RE | Admit: 2019-12-02 | Discharge: 2019-12-02 | Disposition: A | Discharge status: Home / Self Care | Payer: Medicare Other | Attending: Internal Medicine | Admitting: Internal Medicine

## 2019-12-02 ENCOUNTER — Encounter (HOSPITAL_COMMUNITY)
Admission: RE | Disposition: A | Discharge status: Home / Self Care | Payer: Self-pay | Source: Home / Self Care | Attending: Internal Medicine

## 2019-12-02 ENCOUNTER — Encounter (HOSPITAL_COMMUNITY): Payer: Self-pay | Admitting: Internal Medicine

## 2019-12-02 DIAGNOSIS — E119 Type 2 diabetes mellitus without complications: Secondary | ICD-10-CM | POA: Insufficient documentation

## 2019-12-02 DIAGNOSIS — K8050 Calculus of bile duct without cholangitis or cholecystitis without obstruction: Secondary | ICD-10-CM | POA: Diagnosis not present

## 2019-12-02 DIAGNOSIS — R945 Abnormal results of liver function studies: Secondary | ICD-10-CM

## 2019-12-02 DIAGNOSIS — K8051 Calculus of bile duct without cholangitis or cholecystitis with obstruction: Secondary | ICD-10-CM | POA: Insufficient documentation

## 2019-12-02 DIAGNOSIS — Z79899 Other long term (current) drug therapy: Secondary | ICD-10-CM | POA: Insufficient documentation

## 2019-12-02 DIAGNOSIS — I10 Essential (primary) hypertension: Secondary | ICD-10-CM | POA: Diagnosis not present

## 2019-12-02 DIAGNOSIS — K8020 Calculus of gallbladder without cholecystitis without obstruction: Secondary | ICD-10-CM

## 2019-12-02 DIAGNOSIS — R7989 Other specified abnormal findings of blood chemistry: Secondary | ICD-10-CM

## 2019-12-02 HISTORY — PX: ERCP: SHX5425

## 2019-12-02 HISTORY — PX: SPHINCTEROTOMY: SHX5544

## 2019-12-02 HISTORY — PX: REMOVAL OF STONES: SHX5545

## 2019-12-02 LAB — HEPATIC FUNCTION PANEL
ALT: 268 U/L — ABNORMAL HIGH (ref 0–44)
AST: 202 U/L — ABNORMAL HIGH (ref 15–41)
Albumin: 2.5 g/dL — ABNORMAL LOW (ref 3.5–5.0)
Alkaline Phosphatase: 900 U/L — ABNORMAL HIGH (ref 38–126)
Bilirubin, Direct: 4.6 mg/dL — ABNORMAL HIGH (ref 0.0–0.2)
Indirect Bilirubin: 3.6 mg/dL — ABNORMAL HIGH (ref 0.3–0.9)
Total Bilirubin: 8.2 mg/dL — ABNORMAL HIGH (ref 0.3–1.2)
Total Protein: 5.9 g/dL — ABNORMAL LOW (ref 6.5–8.1)

## 2019-12-02 LAB — GLUCOSE, CAPILLARY
Glucose-Capillary: 82 mg/dL (ref 70–99)
Glucose-Capillary: 101 mg/dL — ABNORMAL HIGH (ref 70–99)

## 2019-12-02 LAB — PROTIME-INR
INR: 1.3 — ABNORMAL HIGH (ref 0.8–1.2)
Prothrombin Time: 15.2 seconds (ref 11.4–15.2)

## 2019-12-02 SURGERY — ERCP, WITH INTERVENTION IF INDICATED
Anesthesia: General

## 2019-12-02 MED ORDER — ROCURONIUM BROMIDE 10 MG/ML (PF) SYRINGE
PREFILLED_SYRINGE | INTRAVENOUS | Status: AC
  Filled 2019-12-02: qty 10

## 2019-12-02 MED ORDER — LIDOCAINE HCL (CARDIAC) PF 100 MG/5ML IV SOSY
PREFILLED_SYRINGE | INTRAVENOUS | Status: DC | PRN
  Administered 2019-12-02: 80 mg via INTRAVENOUS

## 2019-12-02 MED ORDER — SODIUM CHLORIDE 0.9 % IV SOLN
INTRAVENOUS | Status: AC
  Filled 2019-12-02: qty 100

## 2019-12-02 MED ORDER — LACTATED RINGERS IV SOLN: INTRAVENOUS | Status: DC

## 2019-12-02 MED ORDER — FENTANYL CITRATE (PF) 100 MCG/2ML IJ SOLN
INTRAMUSCULAR | Status: AC
  Filled 2019-12-02: qty 2

## 2019-12-02 MED ORDER — FENTANYL CITRATE (PF) 100 MCG/2ML IJ SOLN
INTRAMUSCULAR | Status: DC | PRN
  Administered 2019-12-02: 25 ug via INTRAVENOUS
  Administered 2019-12-02: 100 ug via INTRAVENOUS

## 2019-12-02 MED ORDER — ONDANSETRON HCL 4 MG/2ML IJ SOLN: 4.0000 mg | Freq: Once | INTRAMUSCULAR | Status: DC | PRN

## 2019-12-02 MED ORDER — PROPOFOL 10 MG/ML IV BOLUS
INTRAVENOUS | Status: AC
  Filled 2019-12-02: qty 20

## 2019-12-02 MED ORDER — DEXTROSE-NACL 5-0.45 % IV SOLN: INTRAVENOUS | Status: DC

## 2019-12-02 MED ORDER — ONDANSETRON HCL 4 MG/2ML IJ SOLN
INTRAMUSCULAR | Status: AC
  Filled 2019-12-02: qty 2

## 2019-12-02 MED ORDER — SUGAMMADEX SODIUM 500 MG/5ML IV SOLN
INTRAVENOUS | Status: DC | PRN
  Administered 2019-12-02: 200 mg via INTRAVENOUS

## 2019-12-02 MED ORDER — ONDANSETRON HCL 4 MG/2ML IJ SOLN
INTRAMUSCULAR | Status: DC | PRN
  Administered 2019-12-02: 4 mg via INTRAVENOUS

## 2019-12-02 MED ORDER — CHLORHEXIDINE GLUCONATE CLOTH 2 % EX PADS: 6.0000 | MEDICATED_PAD | Freq: Once | CUTANEOUS | Status: DC

## 2019-12-02 MED ORDER — GLUCAGON HCL RDNA (DIAGNOSTIC) 1 MG IJ SOLR
INTRAMUSCULAR | Status: AC
  Filled 2019-12-02: qty 2

## 2019-12-02 MED ORDER — WATER FOR IRRIGATION, STERILE IR SOLN
Status: DC | PRN
  Administered 2019-12-02: 500 mL via SURGICAL_CAVITY

## 2019-12-02 MED ORDER — SODIUM CHLORIDE 0.9 % IV SOLN
INTRAVENOUS | Status: DC | PRN
  Administered 2019-12-02: 30 mL

## 2019-12-02 MED ORDER — ROCURONIUM BROMIDE 100 MG/10ML IV SOLN
INTRAVENOUS | Status: DC | PRN
  Administered 2019-12-02: 50 mg via INTRAVENOUS

## 2019-12-02 MED ORDER — FENTANYL CITRATE (PF) 100 MCG/2ML IJ SOLN: 25.0000 ug | INTRAMUSCULAR | Status: DC | PRN

## 2019-12-02 MED ORDER — SUCCINYLCHOLINE CHLORIDE 200 MG/10ML IV SOSY
PREFILLED_SYRINGE | INTRAVENOUS | Status: DC | PRN
  Administered 2019-12-02: 100 mg via INTRAVENOUS

## 2019-12-02 MED ORDER — SUCCINYLCHOLINE CHLORIDE 200 MG/10ML IV SOSY
PREFILLED_SYRINGE | INTRAVENOUS | Status: AC
  Filled 2019-12-02: qty 10

## 2019-12-02 MED ORDER — LIDOCAINE 2% (20 MG/ML) 5 ML SYRINGE
INTRAMUSCULAR | Status: AC
  Filled 2019-12-02: qty 5

## 2019-12-02 MED ORDER — PROPOFOL 10 MG/ML IV BOLUS
INTRAVENOUS | Status: DC | PRN
  Administered 2019-12-02: 150 mg via INTRAVENOUS

## 2019-12-02 MED ORDER — CEFAZOLIN SODIUM-DEXTROSE 2-4 GM/100ML-% IV SOLN
2.0000 g | INTRAVENOUS | Status: AC
  Administered 2019-12-02: 2 g via INTRAVENOUS
  Filled 2019-12-02: qty 100

## 2019-12-02 MED ORDER — PHENYLEPHRINE 40 MCG/ML (10ML) SYRINGE FOR IV PUSH (FOR BLOOD PRESSURE SUPPORT)
PREFILLED_SYRINGE | INTRAVENOUS | Status: AC
  Filled 2019-12-02: qty 10

## 2019-12-02 MED ORDER — PHENYLEPHRINE HCL (PRESSORS) 10 MG/ML IV SOLN
INTRAVENOUS | Status: DC | PRN
  Administered 2019-12-02: 80 ug via INTRAVENOUS
  Administered 2019-12-02: 40 ug via INTRAVENOUS

## 2019-12-02 NOTE — Op Note (Signed)
[facility] Patient Name: Sean Mcfarland Procedure Date: 12/02/2019 12:44 PM MRN: [id number] Date of Birth: 05-08-1940 Attending MD: Hildred Laser , MD CSN: [id number] Age: 79 Admit Type: Outpatient Procedure:                ERCP Indications:              Common bile duct stone(s) Providers:                Hildred Laser, MD, Otis Peak B. Sharon Seller, RN, Dereck Leep, Technician, Randa Spike,                            Technician Referring MD:             Neil Crouch, PAC and Delphina Cahill, MD Medicines:                General Anesthesia Complications:            No immediate complications. Estimated Blood Loss:     Estimated blood loss: none. Procedure:                Pre-Anesthesia Assessment:                           - Prior to the procedure, a History and Physical                            was performed, and patient medications and                            allergies were reviewed. The patient's tolerance of                            previous anesthesia was also reviewed. The risks                            and benefits of the procedure and the sedation                            options and risks were discussed with the patient.                            All questions were answered, and informed consent                            was obtained. Prior Anticoagulants: The patient has                            taken no previous anticoagulant or antiplatelet                            agents. ASA Grade Assessment: II - A patient with  mild systemic disease. After reviewing the risks                            and benefits, the patient was deemed in                            satisfactory condition to undergo the procedure.                           After obtaining informed consent, the scope was                            passed under direct vision. Throughout the                            procedure, the patient's  blood pressure, pulse, and                            oxygen saturations were monitored continuously. The                            [id number]-Q180V ([id number]) scope was introduced through                            the mouth, and used to inject contrast into and                            used to inject contrast into the bile duct. Ampulla                            of Vater was normal. CBD easily cannulated using                            Hydratome.CBD and CHD dilated to 11 to 12 mm. Large                            filling defect noted distally. Biliary                            sphincterotomy performed. Dormia basket passed                            through the bile duct twice with removal of debris                            and small fragments. Large date shaped stone was                            removed using stone balloon extractor. The stone                            was then removed using a Roth net and acute scope.  The patient tolerated the procedure well. Scope In: 1:39:53 PM Scope Out: 2:13:34 PM Total Procedure Duration: 0 hours 33 minutes 41 seconds  Findings:      The scout film was normal.      Dilated CBD and CHD with large filling defect consistent with stone.      Biliary sphincterotomy performed.      Large stone was removed with stone balloon extractor. Impression:               Dilated CBD and CHD with single stone removed with                            balloon stone extractor. Moderate Sedation:      Per Anesthesia Care Recommendation:           No aspirin or NSAIDs for 72 hours.                           Clear liquids today. Resume usual diet in a.m.                           Resume usual medications as before.                           Follow-up with Ms. Neil Crouch, PA-C at [facility] in 2                            weeks. Procedure Code(s):        --- Professional ---                           [phone], Endoscopic retrograde                             cholangiopancreatography (ERCP); diagnostic,                            including collection of specimen(s) by brushing or                            washing, when performed (separate procedure) Diagnosis Code(s):        --- Professional ---                           K80.50, Calculus of bile duct without cholangitis                            or cholecystitis without obstruction CPT copyright 2019 American Medical Association. All rights reserved. The codes documented in this report are preliminary and upon coder review may  be revised to meet current compliance requirements. Hildred Laser, MD Hildred Laser, MD 12/02/2019 2:36:05 PM This report has been signed electronically. Number of Addenda: 0

## 2019-12-02 NOTE — Progress Notes (Signed)
Brief ERCP note.  Small sliding hiatal hernia. Normal ampulla of Vater. Dilated CBD and CHD with large filling defect distally. Biliary sphincterotomy performed. Few small fragments removed with Dormia basket.  Stone had to be removed with stone balloon extractor. Stone was then removed using Q scope and Jabier Mutton net. Patient tolerated procedure well.

## 2019-12-02 NOTE — Discharge Instructions (Signed)
No aspirin or NSAIDs for 72 hours. Clear liquids this afternoon.  Can have sandwich this evening if you are hungry and not nauseated Resume usual medications as before. Resume usual diet starting on 12/03/2019. No driving for 24 hours.       Endoscopic Retrograde Cholangiopancreatogram, Care After This sheet gives you information about how to care for yourself after your procedure. Your health care provider may also give you more specific instructions. If you have problems or questions, contact your health care provider. What can I expect after the procedure? After the procedure, it is common to have:  Soreness in your throat.  Nausea.  Bloating.  Dizziness.  Tiredness (fatigue). Follow these instructions at home:   Take over-the-counter and prescription medicines only as told by your health care provider.  Do not drive for 24 hours if you were given a medicine to help you relax (sedative) during your procedure. Have someone stay with you for 24 hours after the procedure.  Return to your normal activities as told by your health care provider. Ask your health care provider what activities are safe for you.  Return to eating what you normally do as soon as you feel well enough or as told by your health care provider.  Keep all follow-up visits as told by your health care provider. This is important. Contact a health care provider if:  You have pain in your abdomen that does not get better with medicine.  You develop signs of infection, such as: ? Chills. ? Feeling unwell. Get help right away if:  You have difficulty swallowing.  You have worsening pain in your throat, chest, or abdomen.  You vomit bright red blood or a substance that looks like coffee grounds.  You have bloody or very black stools.  You have a fever.  You have a sudden increase in swelling (bloating) in your abdomen. Summary  After the procedure, it is common to feel tired and to have some  discomfort in your throat.  Contact your health care provider if you have signs of infection--such as chills or feeling unwell--or if you have pain that does not improve with medicine.  Get help right away if you have trouble swallowing, worsening pain, bloody or black vomit, bloody or black stools, a fever, or increased swelling in your abdomen.  Keep all follow-up visits as told by your health care provider. This is important. This information is not intended to replace advice given to you by your health care provider. Make sure you discuss any questions you have with your health care provider. Document Revised: 01/09/2017 Document Reviewed: 12/17/2015 Elsevier Patient Education  2020 [place] Anesthesia, Adult, Care After This sheet gives you information about how to care for yourself after your procedure. Your health care provider may also give you more specific instructions. If you have problems or questions, contact your health care provider. What can I expect after the procedure? After the procedure, the following side effects are common:  Pain or discomfort at the IV site.  Nausea.  Vomiting.  Sore throat.  Trouble concentrating.  Feeling cold or chills.  Weak or tired.  Sleepiness and fatigue.  Soreness and body aches. These side effects can affect parts of the body that were not involved in surgery. Follow these instructions at home:  For at least 24 hours after the procedure:  Have a responsible adult stay with you. It is important to have someone help care for you until  you are awake and alert.  Rest as needed.  Do not: ? Participate in activities in which you could fall or become injured. ? Drive. ? Use heavy machinery. ? Drink alcohol. ? Take sleeping pills or medicines that cause drowsiness. ? Make important decisions or sign legal documents. ? Take care of children on your own. Eating and drinking  Follow any instructions from  your health care provider about eating or drinking restrictions.  When you feel hungry, start by eating small amounts of foods that are soft and easy to digest (bland), such as toast. Gradually return to your regular diet.  Drink enough fluid to keep your urine pale yellow.  If you vomit, rehydrate by drinking water, juice, or clear broth. General instructions  If you have sleep apnea, surgery and certain medicines can increase your risk for breathing problems. Follow instructions from your health care provider about wearing your sleep device: ? Anytime you are sleeping, including during daytime naps. ? While taking prescription pain medicines, sleeping medicines, or medicines that make you drowsy.  Return to your normal activities as told by your health care provider. Ask your health care provider what activities are safe for you.  Take over-the-counter and prescription medicines only as told by your health care provider.  If you smoke, do not smoke without supervision.  Keep all follow-up visits as told by your health care provider. This is important. Contact a health care provider if:  You have nausea or vomiting that does not get better with medicine.  You cannot eat or drink without vomiting.  You have pain that does not get better with medicine.  You are unable to pass urine.  You develop a skin rash.  You have a fever.  You have redness around your IV site that gets worse. Get help right away if:  You have difficulty breathing.  You have chest pain.  You have blood in your urine or stool, or you vomit blood. Summary  After the procedure, it is common to have a sore throat or nausea. It is also common to feel tired.  Have a responsible adult stay with you for the first 24 hours after general anesthesia. It is important to have someone help care for you until you are awake and alert.  When you feel hungry, start by eating small amounts of foods that are soft and  easy to digest (bland), such as toast. Gradually return to your regular diet.  Drink enough fluid to keep your urine pale yellow.  Return to your normal activities as told by your health care provider. Ask your health care provider what activities are safe for you. This information is not intended to replace advice given to you by your health care provider. Make sure you discuss any questions you have with your health care provider. Document Revised: 01/30/2017 Document Reviewed: 09/12/2016 Elsevier Patient Education  [place] Liquid Diet, Adult A clear liquid diet is a diet that includes only liquids and semi-liquids that you can see through. You do not eat any food on this diet. Most people need to follow this diet for only a short time. You may need to follow a clear liquid diet if:  You have a problem right before or after you have surgery.  You did not eat food for a long time.  You had any of these: ? Feeling sick to your stomach (nausea). ? Throwing up (vomiting). ? Passing a watery stool (diarrhea).  You are going to have an exam to look at parts of your digestive system.  You are going to have bowel surgery. The goals of this diet are:  To rest the stomach.  To help you clear the digestive system before an exam.  To make sure that there is enough fluid in your body.  To make sure you get some energy.  To help you get back to eating like you used to. What are tips for following this plan?  A clear liquid is a liquid or semi-liquid that you can see through when you hold it up to a light. An example of this is gelatin.  This diet does not give you all the nutrients that you need. Choose a variety of the liquids that your doctor says you can drink on this diet. That way, you will get as many nutrients as possible.  If you are not sure whether you can have certain items, ask your doctor. If you are unable to swallow a thin liquid, you will need  to thicken it before taking it. This will stop you from breathing it in (aspiration). What foods should I eat?   Water and flavored water.  Fruit juices that do not have pulp, such as cranberry juice and apple juice.  Tea and coffee without milk or cream.  Clear bouillon or broth.  Broth-based soups that have been strained.  Flavored gelatins.  Honey.  Sugar water.  Ice or frozen ice pops that do not have any milk, yogurt, fruit pieces, or fruit pulp in them.  Clear sodas.  Clear sports drinks. The items listed above may not be a complete list of what you can eat and drink. Contact a dietitian for more options. What foods should I avoid?  Juices that have pulp.  Milk.  Cream or cream-based soups.  Yogurt.  Normal foods that are not clear liquids or semi-liquids. The items listed above may not be a complete list of what you should not eat and drink. Contact a dietitian for options. Questions to ask your health care provider  How long do I need to follow this diet?  Are there any medicines that I should change while on this diet? Summary  A clear liquid diet is a diet that includes only liquids and semi-liquids that you can see through.  Some goals of this diet are to rest your stomach, make sure you get enough fluid, and give you some energy.  Avoid liquids with milk, cream, or pulp while you are on this diet. This information is not intended to replace advice given to you by your health care provider. Make sure you discuss any questions you have with your health care provider. Document Revised: 07/20/2017 Document Reviewed: 07/20/2017 Elsevier Patient Education  [place].

## 2019-12-02 NOTE — Anesthesia Procedure Notes (Signed)
Procedure Name: Intubation Date/Time: 12/02/2019 1:20 PM Performed by: Genevie Ann, CRNA Pre-anesthesia Checklist: Patient identified, Emergency Drugs available, Suction available, Patient being monitored and Timeout performed Patient Re-evaluated:Patient Re-evaluated prior to induction Oxygen Delivery Method: Circle system utilized Preoxygenation: Pre-oxygenation with 100% oxygen Induction Type: IV induction and Rapid sequence Laryngoscope Size: Mac and 4 Grade View: Grade II Tube type: Oral Tube size: 7.0 mm Number of attempts: 1 Airway Equipment and Method: Stylet Placement Confirmation: ETT inserted through vocal cords under direct vision,  positive ETCO2 and breath sounds checked- equal and bilateral Secured at: 21 cm Tube secured with: Tape

## 2019-12-02 NOTE — H&P (Signed)
Sean Mcfarland is an 79 y.o. male.   Chief Complaint: Patient is here for ERCP with sphincterotomy and stone extraction. HPI: Patient is 79 year old Caucasian male who was recently found to have abnormal LFTs at Dr. [name] office.  He was seen by Ms. Neil Crouch, PA-C at [facility].  Ultrasound revealed sludge in stones in the gallbladder and bile duct was unremarkable.  There was a 12 x 11 x 11 mm echogenic lesion in right lobe.  He therefore underwent MRI abdomen 10/28/2019.  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  His bile duct was 8 mm.  He was noted to have oblong defect in distal CBD measuring 2 cm in length concerning for a stone.  He was referred referred for therapeutic ERCP. Patient states his urine has been yellow for the last few weeks.  He has had intermittently clay colored stools.  He had one episode of upper abdominal pain few weeks ago after he ate chicken sandwich or a burger from a local store.  He recalls he went to bed and next morning he did not have any pain.  He says he lost 20 pounds but he has gained 6 back.  He is not sure why he has lost weight.  He has not experienced nausea vomiting fever chills or pruritus. He is retired.  He has never smoked cigarettes and does not drink alcohol.  Past Medical History:  Diagnosis Date  . Diabetes mellitus without complication (HCC)    diet controlled  . Hypertension     History reviewed. No pertinent surgical history.  Family History  Problem Relation Age of Onset  . Healthy Mother   . Healthy Father 72       ?stroke  . Breast cancer Daughter        stage 4  . Colon cancer Son        age 23s  . Liver disease Neg Hx    Social History:  reports that he has never smoked. He has never used smokeless tobacco. He reports that he does not drink alcohol and does not use drugs.  Allergies: No Known Allergies  Medications Prior to Admission  Medication Sig Dispense Refill  . benazepril (LOTENSIN) 20  MG tablet daily.      Results for orders placed or performed during the hospital encounter of 12/02/19 (from the past 48 hour(s))  Glucose, capillary     Status: None   Collection Time: 12/02/19 11:27 AM  Result Value Ref Range   Glucose-Capillary 82 70 - 99 mg/dL    Comment: Glucose reference range applies only to samples taken after fasting for at least 8 hours.  Protime-INR     Status: Abnormal   Collection Time: 12/02/19 11:35 AM  Result Value Ref Range   Prothrombin Time 15.2 11.4 - 15.2 seconds   INR 1.3 (H) 0.8 - 1.2    Comment: (NOTE) INR goal varies based on device and disease states. Performed at [facility], [address], [place], [place] [address]   Glucose, capillary     Status: Abnormal   Collection Time: 12/02/19 11:59 AM  Result Value Ref Range   Glucose-Capillary 101 (H) 70 - 99 mg/dL    Comment: Glucose reference range applies only to samples taken after fasting for at least 8 hours.   No results found.  Review of Systems  Blood pressure 139/72, pulse 63, temperature 97.6 F (36.4 C), temperature source Oral, resp. rate 20, SpO2 99 %. Physical Exam  Eyes:     Conjunctiva/sclera: Conjunctivae normal.     Pupils: Pupils are equal, round, and reactive to light.  Cardiovascular:     Rate and Rhythm: Normal rate and regular rhythm.     Heart sounds: Normal heart sounds. No murmur heard.   Pulmonary:     Effort: Pulmonary effort is normal.     Breath sounds: Normal breath sounds.  Abdominal:     Comments: Abdomen is symmetrical soft and nontender with organomegaly or masses.  Musculoskeletal:        General: No swelling.     Cervical back: Neck supple.  Lymphadenopathy:     Cervical: No cervical adenopathy.  Skin:    General: Skin is warm and dry.  Neurological:     Mental Status: He is alert.   Lab data from this morning  Bilirubin is 8.2 AP 900 AST 202 and ALT 268.  Albumin is 2.5 NR 1.3.  MR abdomen images reviewed with Dr. Thornton Papas.  Please note  that this was not an MRCP.  This defect and bile duct is clearly seen though.  It is most consistent with bile duct stone.   Assessment/Plan  Jaundice/cholestasis most likely secondary to choledocholithiasis.  He did experience single episode of abdominal pain few weeks ago. He also has sludge and stones in his gallbladder. ERCP with sphincterotomy and stone extraction.  If for some reason stone cannot be removed biliary stent will be left in place. Have reviewed the procedure risks with the patient including risk of bleeding pancreatitis.  He is agreeable received the procedure. Once bile duct is cleared he should consider having cholecystectomy as well.   Hildred Laser, MD 12/02/2019, 12:07 PM

## 2019-12-02 NOTE — Progress Notes (Signed)
Pt 's CBG is 77 at 7. IV fluids switched to D5 1/2 NS per Dr. Briant Cedar. Rechecked CBG - 101 at 1159.

## 2019-12-02 NOTE — Anesthesia Preprocedure Evaluation (Signed)
Anesthesia Evaluation  Patient identified by MRN, date of birth, ID band Patient awake    Reviewed: Allergy & Precautions, H&P , NPO status , Patient's Chart, lab work & pertinent test results, reviewed documented beta blocker date and time   Airway Mallampati: II  TM Distance: >3 FB Neck ROM: full    Dental no notable dental hx.    Pulmonary neg pulmonary ROS,    Pulmonary exam normal breath sounds clear to auscultation       Cardiovascular Exercise Tolerance: Good hypertension, negative cardio ROS   Rhythm:regular Rate:Normal     Neuro/Psych negative neurological ROS  negative psych ROS   GI/Hepatic negative GI ROS, Neg liver ROS,   Endo/Other  negative endocrine ROSdiabetes  Renal/GU negative Renal ROS  negative genitourinary   Musculoskeletal   Abdominal   Peds  Hematology negative hematology ROS (+)   Anesthesia Other Findings   Reproductive/Obstetrics negative OB ROS                             Anesthesia Physical Anesthesia Plan  ASA: II  Anesthesia Plan: General   Post-op Pain Management:    Induction:   PONV Risk Score and Plan: Ondansetron  Airway Management Planned:   Additional Equipment:   Intra-op Plan:   Post-operative Plan:   Informed Consent: I have reviewed the patients History and Physical, chart, labs and discussed the procedure including the risks, benefits and alternatives for the proposed anesthesia with the patient or authorized representative who has indicated his/her understanding and acceptance.     Dental Advisory Given  Plan Discussed with: CRNA  Anesthesia Plan Comments:         Anesthesia Quick Evaluation

## 2019-12-02 NOTE — Transfer of Care (Signed)
Immediate Anesthesia Transfer of Care Note  Patient: Sean Mcfarland  Procedure(s) Performed: ENDOSCOPIC RETROGRADE CHOLANGIOPANCREATOGRAPHY (ERCP) (N/A ) SPHINCTEROTOMY (N/A ) REMOVAL OF STONES (N/A )  Patient Location: PACU  Anesthesia Type:General  Level of Consciousness: awake, alert  and oriented  Airway & Oxygen Therapy: Patient Spontanous Breathing  Post-op Assessment: Report given to RN and Post -op Vital signs reviewed and stable  Post vital signs: Reviewed and stable  Last Vitals:  Vitals Value Taken Time  BP 140/76 12/02/19 1436  Temp    Pulse 62 12/02/19 1438  Resp 14 12/02/19 1438  SpO2 99 % 12/02/19 1438  Vitals shown include unvalidated device data.  Last Pain:  Vitals:   12/02/19 1118  TempSrc: Oral  PainSc: 0-No pain      Patients Stated Pain Goal: 6 (23/20/09 4179)  Complications: No complications documented.

## 2019-12-04 LAB — GLUCOSE, CAPILLARY: Glucose-Capillary: 224 mg/dL — ABNORMAL HIGH (ref 70–99)

## 2019-12-04 NOTE — Anesthesia Postprocedure Evaluation (Signed)
Anesthesia Post Note  Patient: Sean Mcfarland  Procedure(s) Performed: ENDOSCOPIC RETROGRADE CHOLANGIOPANCREATOGRAPHY (ERCP) (N/A ) SPHINCTEROTOMY (N/A ) REMOVAL OF STONES (N/A )  Patient location during evaluation: Phase II Anesthesia Type: General Level of consciousness: awake Pain management: pain level controlled Vital Signs Assessment: post-procedure vital signs reviewed and stable Respiratory status: spontaneous breathing Cardiovascular status: blood pressure returned to baseline Anesthetic complications: no   No complications documented.   Last Vitals:  Vitals:   12/02/19 1500 12/02/19 1516  BP: (!) 144/84 (!) 142/95  Pulse: 62 61  Resp: 18 18  Temp:  36.6 C  SpO2: 98% 100%    Last Pain:  Vitals:   12/02/19 1516  TempSrc: Axillary  PainSc: 0-No pain                 Louann Sjogren

## 2019-12-05 ENCOUNTER — Other Ambulatory Visit: Payer: Self-pay

## 2019-12-05 ENCOUNTER — Other Ambulatory Visit: Payer: Self-pay | Admitting: Gastroenterology

## 2019-12-05 ENCOUNTER — Telehealth: Payer: Self-pay | Admitting: Gastroenterology

## 2019-12-05 DIAGNOSIS — R945 Abnormal results of liver function studies: Secondary | ICD-10-CM

## 2019-12-05 DIAGNOSIS — R7989 Other specified abnormal findings of blood chemistry: Secondary | ICD-10-CM

## 2019-12-05 NOTE — Addendum Note (Signed)
Addendum  created 12/05/19 0813 by Ollen Bowl, CRNA   Charge Capture section accepted

## 2019-12-05 NOTE — Telephone Encounter (Signed)
Called pt. Once again at 2:22pm, no answer, message left.

## 2019-12-05 NOTE — Telephone Encounter (Signed)
CALLED PT. WASN'T IN, WILL CALL BACK LATER TODAY.

## 2019-12-05 NOTE — Telephone Encounter (Signed)
SUSAN PLEASE SCHEDULE OV WITH DR. Abbey Chatters

## 2019-12-05 NOTE — Telephone Encounter (Signed)
Patient needs ov with Dr. Abbey Chatters or any APP in 2 weeks with LFTs prior to ov. Post-ercp follow up.

## 2019-12-05 NOTE — Telephone Encounter (Signed)
I called [place] office and spoke with [employer]. Informed her that pt had ERCP on 12/02/19 and is going to follow up with [place] in 2 weeks. She stated she would let MD know.

## 2019-12-05 NOTE — Telephone Encounter (Signed)
LAB ORDER DONE

## 2019-12-05 NOTE — Telephone Encounter (Signed)
Returned pts call, LM for him that I would call him back later today.

## 2019-12-06 ENCOUNTER — Encounter (HOSPITAL_COMMUNITY): Payer: Self-pay | Admitting: Internal Medicine

## 2019-12-06 NOTE — Telephone Encounter (Signed)
Sent pt lab form and reminder to do 2 days prior to ov.

## 2019-12-06 NOTE — Telephone Encounter (Signed)
Pt. Stated new medicine cost too much, :Trip was suppose to get with [place] to find a new one. No improvement on his diarrhea.

## 2019-12-07 NOTE — Telephone Encounter (Signed)
Please note dates. Patient's appt for 12/14/19 with Abbey Chatters was cancelled on 11/28/19 because we made him sooner appt with me on 11/29/19.   He has since had ERCP 10/22 and needs OV with [facility] or APP in 2 weeks. Please make the appt. Thanks.  Deana: I believe the note for diarrhea below was inadvertently put in wrong chart.

## 2019-12-07 NOTE — Telephone Encounter (Signed)
Patient was scheduled for upcoming appointment with Dr. Abbey Chatters and he cancelled

## 2019-12-07 NOTE — Telephone Encounter (Signed)
FYI, PT cancelled upcoming appt with Dr. Abbey Chatters on 12/14/2019.

## 2019-12-08 ENCOUNTER — Encounter: Payer: Self-pay | Admitting: Internal Medicine

## 2019-12-08 NOTE — Telephone Encounter (Signed)
Noted  

## 2019-12-08 NOTE — Telephone Encounter (Signed)
Per note below: if an APP can see him sooner we really need to get in him in about 2 weeks or so.

## 2019-12-08 NOTE — Telephone Encounter (Signed)
Scheduled patient with dr. Abbey Chatters and added to a cancellation list

## 2019-12-08 NOTE — Telephone Encounter (Signed)
Sean Mcfarland had a cancellation so he is scheduled for next Thursday.  I called and let him know and he said that he has a lot going on and will call if he cant make that appointment

## 2019-12-12 ENCOUNTER — Other Ambulatory Visit: Payer: Self-pay

## 2019-12-12 DIAGNOSIS — R945 Abnormal results of liver function studies: Secondary | ICD-10-CM

## 2019-12-12 DIAGNOSIS — R7989 Other specified abnormal findings of blood chemistry: Secondary | ICD-10-CM

## 2019-12-12 DIAGNOSIS — R932 Abnormal findings on diagnostic imaging of liver and biliary tract: Secondary | ICD-10-CM

## 2019-12-12 NOTE — Telephone Encounter (Signed)
Noted  

## 2019-12-12 NOTE — Telephone Encounter (Signed)
Noted. Let's keep appt with Dr. Abbey Chatters until after his ov this week. We will decide if it needs to be cancelled after ov with [facility]

## 2019-12-13 LAB — HEPATIC FUNCTION PANEL
ALT: 53 IU/L — ABNORMAL HIGH (ref 0–44)
AST: 38 IU/L (ref 0–40)
Albumin: 3.5 g/dL — ABNORMAL LOW (ref 3.7–4.7)
Alkaline Phosphatase: 381 IU/L — ABNORMAL HIGH (ref 44–121)
Bilirubin Total: 2 mg/dL — ABNORMAL HIGH (ref 0.0–1.2)
Bilirubin, Direct: 1.46 mg/dL — ABNORMAL HIGH (ref 0.00–0.40)
Total Protein: 6.3 g/dL (ref 6.0–8.5)

## 2019-12-14 ENCOUNTER — Ambulatory Visit: Payer: Medicare Other | Admitting: Internal Medicine

## 2019-12-14 NOTE — Progress Notes (Signed)
  Referring Provider: Hall, John Z, MD Primary Care Physician:  Hall, John Z, MD Primary GI Physician: Dr. Carver  Chief Complaint  Patient presents with  . abnormal LFT's    HPI:   Sean Mcfarland is a 79 y.o. male presenting today for follow-up of abnormal LFTs.   He was last seen in our office 11/29/2019 at the time of initial consult for elevated LFTs.  He reported having labs completed with PCP with increasing LFTs.  Labs received from PCP completed September 2021 with total bilirubin 3.9, alk phos 1667, AST 248, ALT 270. Abdominal ultrasound August 2021 with gallbladder filled with biliary sludge and few small stones, 1.2 cm echogenic lesion in the right hepatic lobe suggestive of hemangioma.  MRI abdomen with and without contrast September 2021 with benign hemangioma, subcentimeter cyst in the right lobe of the liver.  Plans to update labs and review MRI with radiology.  Labs 11/29/2019: Hemoglobin 11.4, total bilirubin 6.8, alk phos 1385, AST 282, ALT 287, hepatitis C antibody negative, hepatitis B surface antigen negative, hepatitis B surface antibody negative, hepatitis A negative, AMA and ASMA negative, ANA positive, IgA slightly elevated at 500, IgG and IgM normal, ferritin elevated at 980, iron percent saturation within normal limits, AFP within normal limits.   MRI reviewed with radiology who stated CBD is dilated and has oblong filling defect 2 cm in length.  ERCP 12/02/2019: Dilated CBD and CHD s/p biliary sphincterotomy and large stone removal with balloon stone extractor.  Recommended follow-up in 2 weeks.  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  HFP completed 12/12/19: Total bilirubin 1.46, alk phos 381, AST 38, ALT 53.  Today: Feeling well. No abdominal pain. No nausea or vomiting.   Anemia: No blood in the stool or black stool. No NSAIDs.  Takes a 1 a day vitamin. No blood in the urine , nose bleeds, or other obviously blood loss. No prior colonoscopy or upper endoscopy. Lost about 5 lbs in the last month. Wasn't eating well prior to ERCP but is now back to eating normally. No early satiety, GERD symptoms, or dysphagia. BMs most every day. Rare constipation. No diarrhea.   Prefers not to have colonoscopy unless he absolutely has to.   Pretty active working outside around his house.   Past Medical History:  Diagnosis Date  . Diabetes mellitus without complication (HCC)    diet controlled  . Hypertension     Past Surgical History:  Procedure Laterality Date  . ERCP N/A 12/02/2019   Procedure: ENDOSCOPIC RETROGRADE CHOLANGIOPANCREATOGRAPHY (ERCP);  Surgeon: Rehman, Najeeb U, MD; Dilated CBD and CHD s/p biliary sphincterotomy and large stone removal with balloon stone extractor.  . REMOVAL OF STONES N/A 12/02/2019   Procedure: REMOVAL OF STONES;  Surgeon: Rehman, Najeeb U, MD;  [facility];  Service: Endoscopy;  Laterality: N/A;  . SPHINCTEROTOMY N/A 12/02/2019   Procedure: SPHINCTEROTOMY;  Surgeon: Rehman, Najeeb U, MD;  [facility];  Service: Endoscopy;  Laterality: N/A;    Current Outpatient Medications  Medication Sig Dispense Refill  . benazepril (LOTENSIN) 20 MG tablet Take 1 tablet (20 mg total) by mouth daily.     No current facility-administered medications for this visit.    Allergies as of 12/15/2019  . (No Known Allergies)    Family History  Problem Relation Age of Onset  .   Healthy Mother   . Healthy Father 60       ?stroke  . Breast cancer Daughter        stage 4  . Colon cancer Son        age 50s  . Liver disease Neg Hx     Social History   Socioeconomic History  . Marital status: Married    Spouse name: Not on file  . Number of children: Not on file  . Years of education: Not on file  . Highest education level: Not on file  Occupational History  . Not on file    Tobacco Use  . Smoking status: Never Smoker  . Smokeless tobacco: Never Used  Vaping Use  . Vaping Use: Never used  Substance and Sexual Activity  . Alcohol use: Never  . Drug use: Never  . Sexual activity: Yes  Other Topics Concern  . Not on file  Social History Narrative  . Not on file   Social Determinants of Health   Financial Resource Strain:   . Difficulty of Paying Living Expenses: Not on file  Food Insecurity:   . Worried About Running Out of Food in the Last Year: Not on file  . Ran Out of Food in the Last Year: Not on file  Transportation Needs:   . Lack of Transportation (Medical): Not on file  . Lack of Transportation (Non-Medical): Not on file  Physical Activity:   . Days of Exercise per Week: Not on file  . Minutes of Exercise per Session: Not on file  Stress:   . Feeling of Stress : Not on file  Social Connections:   . Frequency of Communication with Friends and Family: Not on file  . Frequency of Social Gatherings with Friends and Family: Not on file  . Attends Religious Services: Not on file  . Active Member of Clubs or Organizations: Not on file  . Attends Club or Organization Meetings: Not on file  . Marital Status: Not on file    Review of Systems: Gen: Denies fever, chills, cold or flulike symptoms, lightheadedness, dizziness, presyncope, syncope. CV: Denies chest pain or palpitations Resp: Denies dyspnea or cough GI:  See HPI Heme: See HPI  Physical Exam: BP 125/74   Pulse 70   Temp (!) 96.6 F (35.9 C) (Temporal)   Ht 5' 7" (1.702 m)   Wt 140 lb 6.4 oz (63.7 kg)   BMI 21.99 kg/m  General:   Alert and oriented. No distress noted. Pleasant and cooperative.  Head:  Normocephalic and atraumatic. Eyes:  Conjuctiva clear without scleral icterus. Heart:  S1, S2 present without murmurs appreciated. Lungs:  Clear to auscultation bilaterally. No wheezes, rales, or rhonchi. No distress.  Abdomen:  +BS, soft, non-tender and non-distended. No  rebound or guarding. No HSM or masses noted. Msk:  Symmetrical without gross deformities. Normal posture. Extremities:  Without edema. Neurologic:  Alert and  oriented x4 Psych:  Normal mood and affect.  

## 2019-12-15 ENCOUNTER — Encounter: Payer: Self-pay | Admitting: Gastroenterology

## 2019-12-15 ENCOUNTER — Encounter: Payer: Self-pay | Admitting: Internal Medicine

## 2019-12-15 ENCOUNTER — Other Ambulatory Visit: Payer: Self-pay

## 2019-12-15 ENCOUNTER — Ambulatory Visit (INDEPENDENT_AMBULATORY_CARE_PROVIDER_SITE_OTHER): Payer: Medicare Other | Admitting: Gastroenterology

## 2019-12-15 ENCOUNTER — Other Ambulatory Visit: Payer: Self-pay | Admitting: *Deleted

## 2019-12-15 VITALS — BP 125/74 | HR 70 | Temp 96.6°F | Ht 67.0 in | Wt 140.4 lb

## 2019-12-15 DIAGNOSIS — R7989 Other specified abnormal findings of blood chemistry: Secondary | ICD-10-CM

## 2019-12-15 DIAGNOSIS — R945 Abnormal results of liver function studies: Secondary | ICD-10-CM

## 2019-12-15 DIAGNOSIS — R6889 Other general symptoms and signs: Secondary | ICD-10-CM | POA: Diagnosis not present

## 2019-12-15 DIAGNOSIS — K8050 Calculus of bile duct without cholangitis or cholecystitis without obstruction: Secondary | ICD-10-CM | POA: Diagnosis not present

## 2019-12-15 DIAGNOSIS — D649 Anemia, unspecified: Secondary | ICD-10-CM | POA: Diagnosis not present

## 2019-12-15 NOTE — Assessment & Plan Note (Signed)
Addressed under abnormal LFTs.

## 2019-12-15 NOTE — Assessment & Plan Note (Addendum)
79 year old male found to have significantly elevated LFTs in September 2021 with total bilirubin 3.9, alk phos 1667, AST 248, ALT 270. Additional laboratory evaluation with Hep A, B, and C negative, AMA and ASMA negative, ANA positive, IgA slightly elevated at 500, IgG and IgM normal, ferritin elevated at 980, iron percent saturation within normal limits, AFP within normal limits. [place] with biliary sludge and gallstones. MRI with dilated CBD with oblong filling defect that was 2 cm in length. Underwent ERCP 12/02/2019 revealing dilated CBD and CHD s/p biliary sphincterotomy and large stone removal.  Repeat HFP 12/12/2019 with bilirubin down to 1.46, alk phos 381, AST 38, ALT 53. Clinically patient is feeling well with no significant GI symptoms.   Suspect LFT elevation was likely secondary to retained CBD stone. Elevated ferritin likely reactive. Doubt autoimmune hepatitis with IgG normal. He has labs pending for ANA with reflex in January to give him time to recover from retained CBD stone. Will plan to continue to trend LFTs with repeat HFP in 2 weeks. Will also refer to general surgery for consideration of cholecystectomy. Plan to follow-up in 3 months. Advised to monitor for abdominal pain, fever, nausea, or vomiting and let us know if this occurs.

## 2019-12-15 NOTE — Assessment & Plan Note (Addendum)
Hemoglobin 12.2 in September 2021 and down to 11.4 with normocytic indices on 11/29/2019. Creatinine 0.64. Iron panel with elevated ferritin which is likely reactive in the setting of retained CBD stone, now s/p ERCP.  Denies BRBPR, melena, or any other obvious blood loss.  He has never had a colonoscopy or upper endoscopy.  Denies NSAIDs or alcohol use.  He has lost about 5 lbs in the last month, but patient states he wasn't eating well when he had the retained CBD stone but has now returned to eating normally. Reports son has history of colon cancer diagnosed in his 70s. Discussed possibility of a colonoscopy.  Patient is not interested in having any further endoscopic evaluation unless he absolutely has to.  Plan: Repeat CBC and iron panel.  Will also obtain vitamin B12 and folate. Further recommendations to follow.

## 2019-12-15 NOTE — Patient Instructions (Addendum)
Please have labs completed in 2 weeks at [facility]. This should be the week of November 15th.   We are referring to general surgery for consideration of having your gallbladder removed.  Further recommendations following lab results.   We will plan to see you back in 3 months. Please call with any concerns prior. Monitor for abdominal pain, fever, nausea, or vomiting and let us know if this occurs.   Aliene Altes, PA-C [facility]

## 2019-12-20 ENCOUNTER — Telehealth: Payer: Self-pay | Admitting: Internal Medicine

## 2019-12-20 NOTE — Telephone Encounter (Signed)
RETURNED CALL, THIS CALL WAS NOT REGARDING [name] IN FACT IT WAS REGARDING HIS WIFE WHO HAS NEVER BEEN SEEN HERE BEFORE. I CONSULTED WITH Sean Mcfarland AND WAS ADVISED HER PCP NEEDS TO SEND [place] A REFERRAL BEFORE WE CAN ADVISE HER ON ANYTHING. I ADVISED Sean Mcfarland TO CALL HER PCP TO HAVE THEM TO FAX OVER A REFERRAL TO BE SEEN AND WE COULD GET HER IN TODAY BUT WE NEED THAT REFERRAL. SHE STATED SHE HAD A DENTIST APPT. BUT WOULD CALL HER PCP.

## 2019-12-20 NOTE — Telephone Encounter (Signed)
Pt's wife and daughter are aware of OV for the pt's wife.

## 2019-12-20 NOTE — Telephone Encounter (Signed)
Pt's wife called asking to speak with Neil Crouch, PA. I told her Magda Paganini was with patients and I would take a message for the nurse to call her back. [phone]

## 2019-12-22 ENCOUNTER — Ambulatory Visit (INDEPENDENT_AMBULATORY_CARE_PROVIDER_SITE_OTHER): Payer: Medicare Other | Admitting: General Surgery

## 2019-12-22 ENCOUNTER — Other Ambulatory Visit: Payer: Self-pay

## 2019-12-22 ENCOUNTER — Encounter: Payer: Self-pay | Admitting: General Surgery

## 2019-12-22 VITALS — BP 146/76 | HR 67 | Temp 98.2°F | Resp 14 | Ht 67.0 in | Wt 140.0 lb

## 2019-12-22 DIAGNOSIS — K8020 Calculus of gallbladder without cholecystitis without obstruction: Secondary | ICD-10-CM | POA: Diagnosis not present

## 2019-12-22 NOTE — Patient Instructions (Signed)
Laparoscopic Cholecystectomy Laparoscopic cholecystectomy is surgery to remove the gallbladder. The gallbladder is a pear-shaped organ that lies beneath the liver on the right side of the body. The gallbladder stores bile, which is a fluid that helps the body to digest fats. Cholecystectomy is often done for inflammation of the gallbladder (cholecystitis). This condition is usually caused by a buildup of gallstones (cholelithiasis) in the gallbladder. Gallstones can block the flow of bile, which can result in inflammation and pain. In severe cases, emergency surgery may be required. This procedure is done though small incisions in your abdomen (laparoscopic surgery). A thin scope with a camera (laparoscope) is inserted through one incision. Thin surgical instruments are inserted through the other incisions. In some cases, a laparoscopic procedure may be turned into a type of surgery that is done through a larger incision (open surgery). Tell a health care provider about:  Any allergies you have.  All medicines you are taking, including vitamins, herbs, eye drops, creams, and over-the-counter medicines.  Any problems you or family members have had with anesthetic medicines.  Any blood disorders you have.  Any surgeries you have had.  Any medical conditions you have.  Whether you are pregnant or may be pregnant. What are the risks? Generally, this is a safe procedure. However, problems may occur, including:  Infection.  Bleeding.  Allergic reactions to medicines.  Damage to other structures or organs.  A stone remaining in the common bile duct. The common bile duct carries bile from the gallbladder into the small intestine.  A bile leak from the cyst duct that is clipped when your gallbladder is removed. What happens before the procedure?   Medicines  Ask your health care provider about: ? Changing or stopping your regular medicines. This is especially important if you are taking  diabetes medicines or blood thinners. ? Taking medicines such as aspirin and ibuprofen. These medicines can thin your blood. Do not take these medicines before your procedure if your health care provider instructs you not to.  You may be given antibiotic medicine to help prevent infection. General instructions  Let your health care provider know if you develop a cold or an infection before surgery.  Plan to have someone take you home from the hospital or clinic.  Ask your health care provider how your surgical site will be marked or identified. What happens during the procedure?   To reduce your risk of infection: ? Your health care team will wash or sanitize their hands. ? Your skin will be washed with soap. ? Hair may be removed from the surgical area.  An IV tube may be inserted into one of your veins.  You will be given one or more of the following: ? A medicine to help you relax (sedative). ? A medicine to make you fall asleep (general anesthetic).  A breathing tube will be placed in your mouth.  Your surgeon will make several small cuts (incisions) in your abdomen.  The laparoscope will be inserted through one of the small incisions. The camera on the laparoscope will send images to a TV screen (monitor) in the operating room. This lets your surgeon see inside your abdomen.  Air-like gas will be pumped into your abdomen. This will expand your abdomen to give the surgeon more room to perform the surgery.  Other tools that are needed for the procedure will be inserted through the other incisions. The gallbladder will be removed through one of the incisions.  Your common bile duct   may be examined. If stones are found in the common bile duct, they may be removed.  After your gallbladder has been removed, the incisions will be closed with stitches (sutures), staples, or skin glue.  Your incisions may be covered with a bandage (dressing). The procedure may vary among health  care providers and hospitals. What happens after the procedure?  Your blood pressure, heart rate, breathing rate, and blood oxygen level will be monitored until the medicines you were given have worn off.  You will be given medicines as needed to control your pain.  Do not drive for 24 hours if you were given a sedative. This information is not intended to replace advice given to you by your health care provider. Make sure you discuss any questions you have with your health care provider. Document Revised: 01/09/2017 Document Reviewed: 07/16/2015 Elsevier Patient Education  [place].

## 2019-12-22 NOTE — Progress Notes (Signed)
Sean Mcfarland; [id number]; Aug 11, 1940   HPI Patient is an 79 year old white male who recently underwent ERCP with stone extraction who was referred to my care by Dr. Laural Golden for consideration of laparoscopic cholecystectomy.  He does have known stones in the gallbladder.  Since his ERCP, patient has been asymptomatic.  He denies any abdominal pain, nausea, vomiting, fever, chills, or jaundice. Past Medical History:  Diagnosis Date  . Diabetes mellitus without complication (HCC)    diet controlled  . Hypertension     Past Surgical History:  Procedure Laterality Date  . ERCP N/A 12/02/2019   Procedure: ENDOSCOPIC RETROGRADE CHOLANGIOPANCREATOGRAPHY (ERCP);  Surgeon: Rogene Houston, MD; Dilated CBD and CHD s/p biliary sphincterotomy and large stone removal with balloon stone extractor.  Marland Kitchen REMOVAL OF STONES N/A 12/02/2019   Procedure: REMOVAL OF STONES;  Surgeon: Rogene Houston, MD;  [facility];  Service: Endoscopy;  Laterality: N/A;  . SPHINCTEROTOMY N/A 12/02/2019   Procedure: SPHINCTEROTOMY;  Surgeon: Rogene Houston, MD;  [facility];  Service: Endoscopy;  Laterality: N/A;    Family History  Problem Relation Age of Onset  . Healthy Mother   . Healthy Father 96       ?stroke  . Breast cancer Daughter        stage 4  . Colon cancer Son        age 16s  . Liver disease Neg Hx     Current Outpatient Medications on File Prior to Visit  Medication Sig Dispense Refill  . benazepril (LOTENSIN) 20 MG tablet Take 1 tablet (20 mg total) by mouth daily.     No current facility-administered medications on file prior to visit.    No Known Allergies  Social History   Substance and Sexual Activity  Alcohol Use Never    Social History   Tobacco Use  Smoking Status Never Smoker  Smokeless Tobacco Never Used    Review of Systems  Constitutional: Negative.   HENT: Negative.   Eyes: Negative.   Respiratory: Negative.   Cardiovascular: Negative.    Gastrointestinal: Negative.   Genitourinary: Negative.   Musculoskeletal: Negative.   Skin: Negative.   Neurological: Negative.   Endo/Heme/Allergies: Negative.   Psychiatric/Behavioral: Negative.     Objective   Vitals:   12/22/19 1103  BP: (!) 146/76  Pulse: 67  Resp: 14  Temp: 98.2 F (36.8 C)  SpO2: 98%    Physical Exam Vitals reviewed.  Constitutional:      Appearance: Normal appearance. He is normal weight. He is not ill-appearing.  HENT:     Head: Normocephalic and atraumatic.  Eyes:     General: No scleral icterus. Cardiovascular:     Rate and Rhythm: Normal rate and regular rhythm.     Heart sounds: Normal heart sounds. No murmur heard.  No friction rub. No gallop.   Pulmonary:     Effort: Pulmonary effort is normal. No respiratory distress.     Breath sounds: Normal breath sounds. No stridor. No wheezing, rhonchi or rales.  Abdominal:     General: Bowel sounds are normal. There is no distension.     Palpations: Abdomen is soft. There is no mass.     Tenderness: There is no abdominal tenderness. There is no guarding or rebound.     Hernia: No hernia is present.  Skin:    General: Skin is warm and dry.  Neurological:     Mental Status: He is alert and oriented to  person, place, and time.   ERCP and previous admission notes reviewed  Assessment  Cholelithiasis, status post ERCP with stone extraction for choledocholithiasis Plan   Patient wants to delay surgical intervention until later given some family issues that he needs to help out with.  The risks and benefits of the procedure including bleeding, infection, hepatobiliary injury, the possibility of operative procedure were fully explained to the patient, who gave informed consent.  He will call to schedule his laparoscopic cholecystectomy.

## 2019-12-26 DIAGNOSIS — D649 Anemia, unspecified: Secondary | ICD-10-CM | POA: Diagnosis not present

## 2019-12-26 DIAGNOSIS — R6889 Other general symptoms and signs: Secondary | ICD-10-CM | POA: Diagnosis not present

## 2019-12-27 LAB — CBC WITH DIFFERENTIAL/PLATELET
Basophils Absolute: 0 10*3/uL (ref 0.0–0.2)
Basos: 1 %
EOS (ABSOLUTE): 0.2 10*3/uL (ref 0.0–0.4)
Eos: 4 %
Hematocrit: 37.8 % (ref 37.5–51.0)
Hemoglobin: 13 g/dL (ref 13.0–17.7)
Immature Grans (Abs): 0 10*3/uL (ref 0.0–0.1)
Immature Granulocytes: 0 %
Lymphocytes Absolute: 1.9 10*3/uL (ref 0.7–3.1)
Lymphs: 37 %
MCH: 31.7 pg (ref 26.6–33.0)
MCHC: 34.4 g/dL (ref 31.5–35.7)
MCV: 92 fL (ref 79–97)
Monocytes Absolute: 0.4 10*3/uL (ref 0.1–0.9)
Monocytes: 9 %
Neutrophils Absolute: 2.6 10*3/uL (ref 1.4–7.0)
Neutrophils: 49 %
Platelets: 240 10*3/uL (ref 150–450)
RBC: 4.1 x10E6/uL — ABNORMAL LOW (ref 4.14–5.80)
RDW: 13.9 % (ref 11.6–15.4)
WBC: 5.2 10*3/uL (ref 3.4–10.8)

## 2019-12-27 LAB — HEPATIC FUNCTION PANEL
ALT: 26 IU/L (ref 0–44)
AST: 31 IU/L (ref 0–40)
Albumin: 3.8 g/dL (ref 3.7–4.7)
Alkaline Phosphatase: 152 IU/L — ABNORMAL HIGH (ref 44–121)
Bilirubin Total: 1.3 mg/dL — ABNORMAL HIGH (ref 0.0–1.2)
Bilirubin, Direct: 0.82 mg/dL — ABNORMAL HIGH (ref 0.00–0.40)
Total Protein: 6.1 g/dL (ref 6.0–8.5)

## 2019-12-27 LAB — B12 AND FOLATE PANEL
Folate: 9.5 ng/mL (ref >3.0)
Vitamin B-12: 1100 pg/mL (ref 232–1245)

## 2019-12-27 LAB — IRON,TIBC AND FERRITIN PANEL
Ferritin: 348 ng/mL (ref 30–400)
Iron Saturation: 30 % (ref 15–55)
Iron: 82 ug/dL (ref 38–169)
Total Iron Binding Capacity: 269 ug/dL (ref 250–450)
UIBC: 187 ug/dL (ref 111–343)

## 2019-12-28 ENCOUNTER — Other Ambulatory Visit: Payer: Self-pay

## 2019-12-28 DIAGNOSIS — R945 Abnormal results of liver function studies: Secondary | ICD-10-CM

## 2019-12-28 DIAGNOSIS — R7989 Other specified abnormal findings of blood chemistry: Secondary | ICD-10-CM

## 2020-01-03 NOTE — H&P (Signed)
Sean Mcfarland; [id number]; 1940/08/02   HPI Patient is an 79 year old white male who recently underwent ERCP with stone extraction who was referred to my care by Dr. Laural Golden for consideration of laparoscopic cholecystectomy.  He does have known stones in the gallbladder.  Since his ERCP, patient has been asymptomatic.  He denies any abdominal pain, nausea, vomiting, fever, chills, or jaundice. Past Medical History:  Diagnosis Date  . Diabetes mellitus without complication (HCC)    diet controlled  . Hypertension     Past Surgical History:  Procedure Laterality Date  . ERCP N/A 12/02/2019   Procedure: ENDOSCOPIC RETROGRADE CHOLANGIOPANCREATOGRAPHY (ERCP);  Surgeon: Rogene Houston, MD; Dilated CBD and CHD s/p biliary sphincterotomy and large stone removal with balloon stone extractor.  Marland Kitchen REMOVAL OF STONES N/A 12/02/2019   Procedure: REMOVAL OF STONES;  Surgeon: Rogene Houston, MD;  [facility];  Service: Endoscopy;  Laterality: N/A;  . SPHINCTEROTOMY N/A 12/02/2019   Procedure: SPHINCTEROTOMY;  Surgeon: Rogene Houston, MD;  [facility];  Service: Endoscopy;  Laterality: N/A;    Family History  Problem Relation Age of Onset  . Healthy Mother   . Healthy Father 83       ?stroke  . Breast cancer Daughter        stage 4  . Colon cancer Son        age 53s  . Liver disease Neg Hx     Current Outpatient Medications on File Prior to Visit  Medication Sig Dispense Refill  . benazepril (LOTENSIN) 20 MG tablet Take 1 tablet (20 mg total) by mouth daily.     No current facility-administered medications on file prior to visit.    No Known Allergies  Social History   Substance and Sexual Activity  Alcohol Use Never    Social History   Tobacco Use  Smoking Status Never Smoker  Smokeless Tobacco Never Used    Review of Systems  Constitutional: Negative.   HENT: Negative.   Eyes: Negative.   Respiratory: Negative.   Cardiovascular: Negative.    Gastrointestinal: Negative.   Genitourinary: Negative.   Musculoskeletal: Negative.   Skin: Negative.   Neurological: Negative.   Endo/Heme/Allergies: Negative.   Psychiatric/Behavioral: Negative.     Objective   Vitals:   12/22/19 1103  BP: (!) 146/76  Pulse: 67  Resp: 14  Temp: 98.2 F (36.8 C)  SpO2: 98%    Physical Exam Vitals reviewed.  Constitutional:      Appearance: Normal appearance. He is normal weight. He is not ill-appearing.  HENT:     Head: Normocephalic and atraumatic.  Eyes:     General: No scleral icterus. Cardiovascular:     Rate and Rhythm: Normal rate and regular rhythm.     Heart sounds: Normal heart sounds. No murmur heard.  No friction rub. No gallop.   Pulmonary:     Effort: Pulmonary effort is normal. No respiratory distress.     Breath sounds: Normal breath sounds. No stridor. No wheezing, rhonchi or rales.  Abdominal:     General: Bowel sounds are normal. There is no distension.     Palpations: Abdomen is soft. There is no mass.     Tenderness: There is no abdominal tenderness. There is no guarding or rebound.     Hernia: No hernia is present.  Skin:    General: Skin is warm and dry.  Neurological:     Mental Status: He is alert and oriented to  person, place, and time.   ERCP and previous admission notes reviewed  Assessment  Cholelithiasis, status post ERCP with stone extraction for choledocholithiasis Plan   Patient wants to delay surgical intervention until later given some family issues that he needs to help out with.  The risks and benefits of the procedure including bleeding, infection, hepatobiliary injury, the possibility of operative procedure were fully explained to the patient, who gave informed consent.  He will call to schedule his laparoscopic cholecystectomy.

## 2020-01-10 NOTE — Patient Instructions (Signed)
Sean Mcfarland  01/10/2020     @PREFPERIOPPHARMACY @   Your procedure is scheduled on  01/13/2020.  Report to [facility] at  0930  A.M.  Call this number if you have problems the morning of surgery:  [phone]   Remember:  Do not eat or drink after midnight.                         Take these medicines the morning of surgery with A SIP OF WATER  None     Do not wear jewelry, make-up or nail polish.  Do not wear lotions, powders, or perfumes. Please wear deodorant and brush your teeth.  Do not shave 48 hours prior to surgery.  Men may shave face and neck.  Do not bring valuables to the hospital.  [facility] is not responsible for any belongings or valuables.  Contacts, dentures or bridgework may not be worn into surgery.  Leave your suitcase in the car.  After surgery it may be brought to your room.  For patients admitted to the hospital, discharge time will be determined by your treatment team.  Patients discharged the day of surgery will not be allowed to drive home.   Name and phone number of your driver:   family Special instructions:  DO NOT smoke the morning of your procedure.  Please read over the following fact sheets that you were given. Anesthesia Post-op Instructions and Care and Recovery After Surgery       Laparoscopic Cholecystectomy, Care After This sheet gives you information about how to care for yourself after your procedure. Your health care provider may also give you more specific instructions. If you have problems or questions, contact your health care provider. What can I expect after the procedure? After the procedure, it is common to have:  Pain at your incision sites. You will be given medicines to control this pain.  Mild nausea or vomiting.  Bloating and possible shoulder pain from the air-like gas that was used during the procedure. Follow these instructions at home: Incision care   Follow instructions from your health  care provider about how to take care of your incisions. Make sure you: ? Wash your hands with soap and water before you change your bandage (dressing). If soap and water are not available, use hand sanitizer. ? Change your dressing as told by your health care provider. ? Leave stitches (sutures), skin glue, or adhesive strips in place. These skin closures may need to be in place for 2 weeks or longer. If adhesive strip edges start to loosen and curl up, you may trim the loose edges. Do not remove adhesive strips completely unless your health care provider tells you to do that.  Do not take baths, swim, or use a hot tub until your health care provider approves. Ask your health care provider if you can take showers. You may only be allowed to take sponge baths for bathing.  Check your incision area every day for signs of infection. Check for: ? More redness, swelling, or pain. ? More fluid or blood. ? Warmth. ? Pus or a bad smell. Activity  Do not drive or use heavy machinery while taking prescription pain medicine.  Do not lift anything that is heavier than 10 lb (4.5 kg) until your health care provider approves.  Do not play contact sports until your health care provider approves.  Do not drive  for 24 hours if you were given a medicine to help you relax (sedative).  Rest as needed. Do not return to work or school until your health care provider approves. General instructions  Take over-the-counter and prescription medicines only as told by your health care provider.  To prevent or treat constipation while you are taking prescription pain medicine, your health care provider may recommend that you: ? Drink enough fluid to keep your urine clear or pale yellow. ? Take over-the-counter or prescription medicines. ? Eat foods that are high in fiber, such as fresh fruits and vegetables, whole grains, and beans. ? Limit foods that are high in fat and processed sugars, such as fried and sweet  foods. Contact a health care provider if:  You develop a rash.  You have more redness, swelling, or pain around your incisions.  You have more fluid or blood coming from your incisions.  Your incisions feel warm to the touch.  You have pus or a bad smell coming from your incisions.  You have a fever.  One or more of your incisions breaks open. Get help right away if:  You have trouble breathing.  You have chest pain.  You have increasing pain in your shoulders.  You faint or feel dizzy when you stand.  You have severe pain in your abdomen.  You have nausea or vomiting that lasts for more than one day.  You have leg pain. This information is not intended to replace advice given to you by your health care provider. Make sure you discuss any questions you have with your health care provider. Document Revised: 01/09/2017 Document Reviewed: 07/16/2015 Elsevier Patient Education  2020 [place] Anesthesia, Adult, Care After This sheet gives you information about how to care for yourself after your procedure. Your health care provider may also give you more specific instructions. If you have problems or questions, contact your health care provider. What can I expect after the procedure? After the procedure, the following side effects are common:  Pain or discomfort at the IV site.  Nausea.  Vomiting.  Sore throat.  Trouble concentrating.  Feeling cold or chills.  Weak or tired.  Sleepiness and fatigue.  Soreness and body aches. These side effects can affect parts of the body that were not involved in surgery. Follow these instructions at home:  For at least 24 hours after the procedure:  Have a responsible adult stay with you. It is important to have someone help care for you until you are awake and alert.  Rest as needed.  Do not: ? Participate in activities in which you could fall or become injured. ? Drive. ? Use heavy machinery. ? Drink  alcohol. ? Take sleeping pills or medicines that cause drowsiness. ? Make important decisions or sign legal documents. ? Take care of children on your own. Eating and drinking  Follow any instructions from your health care provider about eating or drinking restrictions.  When you feel hungry, start by eating small amounts of foods that are soft and easy to digest (bland), such as toast. Gradually return to your regular diet.  Drink enough fluid to keep your urine pale yellow.  If you vomit, rehydrate by drinking water, juice, or clear broth. General instructions  If you have sleep apnea, surgery and certain medicines can increase your risk for breathing problems. Follow instructions from your health care provider about wearing your sleep device: ? Anytime you are sleeping, including during daytime naps. ? While  taking prescription pain medicines, sleeping medicines, or medicines that make you drowsy.  Return to your normal activities as told by your health care provider. Ask your health care provider what activities are safe for you.  Take over-the-counter and prescription medicines only as told by your health care provider.  If you smoke, do not smoke without supervision.  Keep all follow-up visits as told by your health care provider. This is important. Contact a health care provider if:  You have nausea or vomiting that does not get better with medicine.  You cannot eat or drink without vomiting.  You have pain that does not get better with medicine.  You are unable to pass urine.  You develop a skin rash.  You have a fever.  You have redness around your IV site that gets worse. Get help right away if:  You have difficulty breathing.  You have chest pain.  You have blood in your urine or stool, or you vomit blood. Summary  After the procedure, it is common to have a sore throat or nausea. It is also common to feel tired.  Have a responsible adult stay with you  for the first 24 hours after general anesthesia. It is important to have someone help care for you until you are awake and alert.  When you feel hungry, start by eating small amounts of foods that are soft and easy to digest (bland), such as toast. Gradually return to your regular diet.  Drink enough fluid to keep your urine pale yellow.  Return to your normal activities as told by your health care provider. Ask your health care provider what activities are safe for you. This information is not intended to replace advice given to you by your health care provider. Make sure you discuss any questions you have with your health care provider. Document Revised: 01/30/2017 Document Reviewed: 09/12/2016 Elsevier Patient Education  [employer]. How to Use Chlorhexidine for Bathing Chlorhexidine gluconate (CHG) is a germ-killing (antiseptic) solution that is used to clean the skin. It can get rid of the bacteria that normally live on the skin and can keep them away for about 24 hours. To clean your skin with CHG, you may be given:  A CHG solution to use in the shower or as part of a sponge bath.  A prepackaged cloth that contains CHG. Cleaning your skin with CHG may help lower the risk for infection:  While you are staying in the intensive care unit of the hospital.  If you have a vascular access, such as a central line, to provide short-term or long-term access to your veins.  If you have a catheter to drain urine from your bladder.  If you are on a ventilator. A ventilator is a machine that helps you breathe by moving air in and out of your lungs.  After surgery. What are the risks? Risks of using CHG include:  A skin reaction.  Hearing loss, if CHG gets in your ears.  Eye injury, if CHG gets in your eyes and is not rinsed out.  The CHG product catching fire. Make sure that you avoid smoking and flames after applying CHG to your skin. Do not use CHG:  If you have a  chlorhexidine allergy or have previously reacted to chlorhexidine.  On babies younger than 81 months of age. How to use CHG solution  Use CHG only as told by your health care provider, and follow the instructions on the label.  Use the  full amount of CHG as directed. Usually, this is one bottle. During a shower Follow these steps when using CHG solution during a shower (unless your health care provider gives you different instructions): 1. Start the shower. 2. Use your normal soap and shampoo to wash your face and hair. 3. Turn off the shower or move out of the shower stream. 4. Pour the CHG onto a clean washcloth. Do not use any type of brush or rough-edged sponge. 5. Starting at your neck, lather your body down to your toes. Make sure you follow these instructions: ? If you will be having surgery, pay special attention to the part of your body where you will be having surgery. Scrub this area for at least 1 minute. ? Do not use CHG on your head or face. If the solution gets into your ears or eyes, rinse them well with water. ? Avoid your genital area. ? Avoid any areas of skin that have broken skin, cuts, or scrapes. ? Scrub your back and under your arms. Make sure to wash skin folds. 6. Let the lather sit on your skin for 1-2 minutes or as long as told by your health care provider. 7. Thoroughly rinse your entire body in the shower. Make sure that all body creases and crevices are rinsed well. 8. Dry off with a clean towel. Do not put any substances on your body afterward--such as powder, lotion, or perfume--unless you are told to do so by your health care provider. Only use lotions that are recommended by the manufacturer. 9. Put on clean clothes or pajamas. 10. If it is the night before your surgery, sleep in clean sheets.  During a sponge bath Follow these steps when using CHG solution during a sponge bath (unless your health care provider gives you different instructions): 1. Use  your normal soap and shampoo to wash your face and hair. 2. Pour the CHG onto a clean washcloth. 3. Starting at your neck, lather your body down to your toes. Make sure you follow these instructions: ? If you will be having surgery, pay special attention to the part of your body where you will be having surgery. Scrub this area for at least 1 minute. ? Do not use CHG on your head or face. If the solution gets into your ears or eyes, rinse them well with water. ? Avoid your genital area. ? Avoid any areas of skin that have broken skin, cuts, or scrapes. ? Scrub your back and under your arms. Make sure to wash skin folds. 4. Let the lather sit on your skin for 1-2 minutes or as long as told by your health care provider. 5. Using a different clean, wet washcloth, thoroughly rinse your entire body. Make sure that all body creases and crevices are rinsed well. 6. Dry off with a clean towel. Do not put any substances on your body afterward--such as powder, lotion, or perfume--unless you are told to do so by your health care provider. Only use lotions that are recommended by the manufacturer. 7. Put on clean clothes or pajamas. 8. If it is the night before your surgery, sleep in clean sheets. How to use CHG prepackaged cloths  Only use CHG cloths as told by your health care provider, and follow the instructions on the label.  Use the CHG cloth on clean, dry skin.  Do not use the CHG cloth on your head or face unless your health care provider tells you to.  When washing with  the CHG cloth: ? Avoid your genital area. ? Avoid any areas of skin that have broken skin, cuts, or scrapes. Before surgery Follow these steps when using a CHG cloth to clean before surgery (unless your health care provider gives you different instructions): 1. Using the CHG cloth, vigorously scrub the part of your body where you will be having surgery. Scrub using a back-and-forth motion for 3 minutes. The area on your body  should be completely wet with CHG when you are done scrubbing. 2. Do not rinse. Discard the cloth and let the area air-dry. Do not put any substances on the area afterward, such as powder, lotion, or perfume. 3. Put on clean clothes or pajamas. 4. If it is the night before your surgery, sleep in clean sheets.  For general bathing Follow these steps when using CHG cloths for general bathing (unless your health care provider gives you different instructions). 1. Use a separate CHG cloth for each area of your body. Make sure you wash between any folds of skin and between your fingers and toes. Wash your body in the following order, switching to a new cloth after each step: ? The front of your neck, shoulders, and chest. ? Both of your arms, under your arms, and your hands. ? Your stomach and groin area, avoiding the genitals. ? Your right leg and foot. ? Your left leg and foot. ? The back of your neck, your back, and your buttocks. 2. Do not rinse. Discard the cloth and let the area air-dry. Do not put any substances on your body afterward--such as powder, lotion, or perfume--unless you are told to do so by your health care provider. Only use lotions that are recommended by the manufacturer. 3. Put on clean clothes or pajamas. Contact a health care provider if:  Your skin gets irritated after scrubbing.  You have questions about using your solution or cloth. Get help right away if:  Your eyes become very red or swollen.  Your eyes itch badly.  Your skin itches badly and is red or swollen.  Your hearing changes.  You have trouble seeing.  You have swelling or tingling in your mouth or throat.  You have trouble breathing.  You swallow any chlorhexidine. Summary  Chlorhexidine gluconate (CHG) is a germ-killing (antiseptic) solution that is used to clean the skin. Cleaning your skin with CHG may help to lower your risk for infection.  You may be given CHG to use for bathing. It may  be in a bottle or in a prepackaged cloth to use on your skin. Carefully follow your health care provider's instructions and the instructions on the product label.  Do not use CHG if you have a chlorhexidine allergy.  Contact your health care provider if your skin gets irritated after scrubbing. This information is not intended to replace advice given to you by your health care provider. Make sure you discuss any questions you have with your health care provider. Document Revised: 04/15/2018 Document Reviewed: 12/25/2016 Elsevier Patient Education  [place].

## 2020-01-12 ENCOUNTER — Other Ambulatory Visit (HOSPITAL_COMMUNITY)
Admission: RE | Admit: 2020-01-12 | Discharge: 2020-01-12 | Disposition: A | Discharge status: Home / Self Care | Payer: Medicare Other | Source: Ambulatory Visit | Attending: General Surgery | Admitting: General Surgery

## 2020-01-12 ENCOUNTER — Encounter (HOSPITAL_COMMUNITY): Payer: Self-pay

## 2020-01-12 ENCOUNTER — Ambulatory Visit: Payer: Medicare Other | Admitting: Internal Medicine

## 2020-01-12 ENCOUNTER — Encounter (HOSPITAL_COMMUNITY)
Admission: RE | Admit: 2020-01-12 | Discharge: 2020-01-12 | Disposition: A | Discharge status: Home / Self Care | Payer: Medicare Other | Source: Ambulatory Visit | Attending: General Surgery | Admitting: General Surgery

## 2020-01-12 ENCOUNTER — Other Ambulatory Visit: Payer: Self-pay

## 2020-01-12 DIAGNOSIS — Z20822 Contact with and (suspected) exposure to covid-19: Secondary | ICD-10-CM | POA: Insufficient documentation

## 2020-01-12 DIAGNOSIS — Z01818 Encounter for other preprocedural examination: Secondary | ICD-10-CM | POA: Diagnosis not present

## 2020-01-12 HISTORY — DX: Other complications of anesthesia, initial encounter: T88.59XA

## 2020-01-12 LAB — COMPREHENSIVE METABOLIC PANEL
ALT: 26 U/L (ref 0–44)
AST: 55 U/L — ABNORMAL HIGH (ref 15–41)
Albumin: 3.6 g/dL (ref 3.5–5.0)
Alkaline Phosphatase: 62 U/L (ref 38–126)
Anion gap: 8 (ref 5–15)
BUN: 26 mg/dL — ABNORMAL HIGH (ref 8–23)
CO2: 27 mmol/L (ref 22–32)
Calcium: 9 mg/dL (ref 8.9–10.3)
Chloride: 103 mmol/L (ref 98–111)
Creatinine, Ser: 0.66 mg/dL (ref 0.61–1.24)
GFR, Estimated: >60 mL/min (ref >60)
Glucose, Bld: 101 mg/dL — ABNORMAL HIGH (ref 70–99)
Potassium: 3.6 mmol/L (ref 3.5–5.1)
Sodium: 138 mmol/L (ref 135–145)
Total Bilirubin: 1 mg/dL (ref 0.3–1.2)
Total Protein: 6.6 g/dL (ref 6.5–8.1)

## 2020-01-12 LAB — CBC WITH DIFFERENTIAL/PLATELET
Abs Immature Granulocytes: 0.01 10*3/uL (ref 0.00–0.07)
Basophils Absolute: 0 10*3/uL (ref 0.0–0.1)
Basophils Relative: 1 %
Eosinophils Absolute: 0.2 10*3/uL (ref 0.0–0.5)
Eosinophils Relative: 4 %
HCT: 39.2 % (ref 39.0–52.0)
Hemoglobin: 12.7 g/dL — ABNORMAL LOW (ref 13.0–17.0)
Immature Granulocytes: 0 %
Lymphocytes Relative: 38 %
Lymphs Abs: 2.1 10*3/uL (ref 0.7–4.0)
MCH: 31.1 pg (ref 26.0–34.0)
MCHC: 32.4 g/dL (ref 30.0–36.0)
MCV: 95.8 fL (ref 80.0–100.0)
Monocytes Absolute: 0.5 10*3/uL (ref 0.1–1.0)
Monocytes Relative: 10 %
Neutro Abs: 2.7 10*3/uL (ref 1.7–7.7)
Neutrophils Relative %: 47 %
Platelets: 222 10*3/uL (ref 150–400)
RBC: 4.09 MIL/uL — ABNORMAL LOW (ref 4.22–5.81)
RDW: 14.6 % (ref 11.5–15.5)
WBC: 5.6 10*3/uL (ref 4.0–10.5)
nRBC: 0 % (ref 0.0–0.2)

## 2020-01-12 LAB — HEMOGLOBIN A1C
Hgb A1c MFr Bld: 5.5 % (ref 4.8–5.6)
Mean Plasma Glucose: 111.15 mg/dL

## 2020-01-12 LAB — SARS CORONAVIRUS 2 (TAT 6-24 HRS): SARS Coronavirus 2: NEGATIVE

## 2020-01-13 ENCOUNTER — Ambulatory Visit (HOSPITAL_COMMUNITY)
Admission: RE | Admit: 2020-01-13 | Discharge: 2020-01-13 | Disposition: A | Discharge status: Home / Self Care | Payer: Medicare Other | Attending: General Surgery | Admitting: General Surgery

## 2020-01-13 ENCOUNTER — Encounter (HOSPITAL_COMMUNITY): Payer: Self-pay | Admitting: General Surgery

## 2020-01-13 ENCOUNTER — Ambulatory Visit (HOSPITAL_COMMUNITY): Payer: Medicare Other | Admitting: Certified Registered"

## 2020-01-13 ENCOUNTER — Encounter (HOSPITAL_COMMUNITY)
Admission: RE | Disposition: A | Discharge status: Home / Self Care | Payer: Self-pay | Source: Home / Self Care | Attending: General Surgery

## 2020-01-13 DIAGNOSIS — K8020 Calculus of gallbladder without cholecystitis without obstruction: Secondary | ICD-10-CM

## 2020-01-13 DIAGNOSIS — R590 Localized enlarged lymph nodes: Secondary | ICD-10-CM | POA: Diagnosis not present

## 2020-01-13 DIAGNOSIS — K8010 Calculus of gallbladder with chronic cholecystitis without obstruction: Secondary | ICD-10-CM | POA: Diagnosis not present

## 2020-01-13 HISTORY — PX: CHOLECYSTECTOMY: SHX55

## 2020-01-13 LAB — GLUCOSE, CAPILLARY: Glucose-Capillary: 175 mg/dL — ABNORMAL HIGH (ref 70–99)

## 2020-01-13 SURGERY — LAPAROSCOPIC CHOLECYSTECTOMY
Anesthesia: General

## 2020-01-13 MED ORDER — BUPIVACAINE LIPOSOME 1.3 % IJ SUSP
INTRAMUSCULAR | Status: AC
  Filled 2020-01-13: qty 20

## 2020-01-13 MED ORDER — ROCURONIUM BROMIDE 10 MG/ML (PF) SYRINGE
PREFILLED_SYRINGE | INTRAVENOUS | Status: AC
  Filled 2020-01-13: qty 10

## 2020-01-13 MED ORDER — SUGAMMADEX SODIUM 200 MG/2ML IV SOLN
INTRAVENOUS | Status: DC | PRN
  Administered 2020-01-13: 200 mg via INTRAVENOUS

## 2020-01-13 MED ORDER — PHENYLEPHRINE 40 MCG/ML (10ML) SYRINGE FOR IV PUSH (FOR BLOOD PRESSURE SUPPORT)
PREFILLED_SYRINGE | INTRAVENOUS | Status: AC
  Filled 2020-01-13: qty 10

## 2020-01-13 MED ORDER — FENTANYL CITRATE (PF) 250 MCG/5ML IJ SOLN
INTRAMUSCULAR | Status: AC
  Filled 2020-01-13: qty 5

## 2020-01-13 MED ORDER — LIDOCAINE 2% (20 MG/ML) 5 ML SYRINGE
INTRAMUSCULAR | Status: DC | PRN
  Administered 2020-01-13: 100 mg via INTRAVENOUS

## 2020-01-13 MED ORDER — ROCURONIUM BROMIDE 10 MG/ML (PF) SYRINGE
PREFILLED_SYRINGE | INTRAVENOUS | Status: DC | PRN
  Administered 2020-01-13: 50 mg via INTRAVENOUS

## 2020-01-13 MED ORDER — CHLORHEXIDINE GLUCONATE CLOTH 2 % EX PADS: 6.0000 | MEDICATED_PAD | Freq: Once | CUTANEOUS | Status: DC

## 2020-01-13 MED ORDER — FENTANYL CITRATE (PF) 100 MCG/2ML IJ SOLN: 25.0000 ug | INTRAMUSCULAR | Status: DC | PRN

## 2020-01-13 MED ORDER — FENTANYL CITRATE (PF) 100 MCG/2ML IJ SOLN
INTRAMUSCULAR | Status: DC | PRN
  Administered 2020-01-13 (×3): 50 ug via INTRAVENOUS

## 2020-01-13 MED ORDER — SODIUM CHLORIDE 0.9 % IR SOLN
Status: DC | PRN
  Administered 2020-01-13: 1000 mL

## 2020-01-13 MED ORDER — PROPOFOL 10 MG/ML IV BOLUS
INTRAVENOUS | Status: AC
  Filled 2020-01-13: qty 20

## 2020-01-13 MED ORDER — HEMOSTATIC AGENTS (NO CHARGE) OPTIME
TOPICAL | Status: DC | PRN
  Administered 2020-01-13: 1 via TOPICAL

## 2020-01-13 MED ORDER — KETOROLAC TROMETHAMINE 30 MG/ML IJ SOLN
15.0000 mg | Freq: Once | INTRAMUSCULAR | Status: AC
  Administered 2020-01-13: 15 mg via INTRAVENOUS
  Filled 2020-01-13: qty 1

## 2020-01-13 MED ORDER — DEXAMETHASONE SODIUM PHOSPHATE 10 MG/ML IJ SOLN
INTRAMUSCULAR | Status: DC | PRN
  Administered 2020-01-13: 6 mg via INTRAVENOUS

## 2020-01-13 MED ORDER — PHENYLEPHRINE 40 MCG/ML (10ML) SYRINGE FOR IV PUSH (FOR BLOOD PRESSURE SUPPORT)
PREFILLED_SYRINGE | INTRAVENOUS | Status: DC | PRN
  Administered 2020-01-13: 80 ug via INTRAVENOUS

## 2020-01-13 MED ORDER — DEXAMETHASONE SODIUM PHOSPHATE 10 MG/ML IJ SOLN
INTRAMUSCULAR | Status: AC
  Filled 2020-01-13: qty 1

## 2020-01-13 MED ORDER — LIDOCAINE HCL (PF) 2 % IJ SOLN
INTRAMUSCULAR | Status: AC
  Filled 2020-01-13: qty 5

## 2020-01-13 MED ORDER — LACTATED RINGERS IV SOLN: INTRAVENOUS | Status: DC | PRN

## 2020-01-13 MED ORDER — CEFAZOLIN SODIUM-DEXTROSE 2-4 GM/100ML-% IV SOLN
2.0000 g | INTRAVENOUS | Status: AC
  Administered 2020-01-13: 2 g via INTRAVENOUS

## 2020-01-13 MED ORDER — CHLORHEXIDINE GLUCONATE 0.12 % MT SOLN
15.0000 mL | Freq: Once | OROMUCOSAL | Status: AC
  Administered 2020-01-13: 15 mL via OROMUCOSAL

## 2020-01-13 MED ORDER — ONDANSETRON HCL 4 MG/2ML IJ SOLN
INTRAMUSCULAR | Status: AC
  Filled 2020-01-13: qty 2

## 2020-01-13 MED ORDER — ONDANSETRON HCL 4 MG/2ML IJ SOLN
INTRAMUSCULAR | Status: DC | PRN
  Administered 2020-01-13: 4 mg via INTRAVENOUS

## 2020-01-13 MED ORDER — PROPOFOL 10 MG/ML IV BOLUS
INTRAVENOUS | Status: DC | PRN
  Administered 2020-01-13: 120 mg via INTRAVENOUS

## 2020-01-13 MED ORDER — TRAMADOL HCL 50 MG PO TABS
50.0000 mg | ORAL_TABLET | Freq: Four times a day (QID) | ORAL | 0 refills | Status: AC | PRN
Start: 2020-01-13 — End: ?

## 2020-01-13 MED ORDER — LACTATED RINGERS IV SOLN
INTRAVENOUS | Status: DC
  Administered 2020-01-13: 1000 mL via INTRAVENOUS

## 2020-01-13 MED ORDER — ORAL CARE MOUTH RINSE: 15.0000 mL | Freq: Once | OROMUCOSAL | Status: AC

## 2020-01-13 MED ORDER — BUPIVACAINE LIPOSOME 1.3 % IJ SUSP
INTRAMUSCULAR | Status: DC | PRN
  Administered 2020-01-13: 20 mL

## 2020-01-13 SURGICAL SUPPLY — 43 items
ADH SKN CLS APL DERMABOND .7 (GAUZE/BANDAGES/DRESSINGS) ×1
APL SRG 38 LTWT LNG FL B (MISCELLANEOUS)
APPLICATOR ARISTA FLEXITIP XL (MISCELLANEOUS) IMPLANT
APPLIER CLIP ROT 10 11.4 M/L (STAPLE) ×2
APR CLP MED LRG 11.4X10 (STAPLE) ×1
BAG RETRIEVAL 10 (BASKET) ×1
CLIP APPLIE ROT 10 11.4 M/L (STAPLE) ×1 IMPLANT
CLOTH BEACON ORANGE TIMEOUT ST (SAFETY) ×2 IMPLANT
COVER LIGHT HANDLE STERIS (MISCELLANEOUS) ×4 IMPLANT
COVER WAND RF STERILE (DRAPES) ×2 IMPLANT
DERMABOND ADVANCED (GAUZE/BANDAGES/DRESSINGS) ×1
DERMABOND ADVANCED .7 DNX12 (GAUZE/BANDAGES/DRESSINGS) ×1 IMPLANT
DURAPREP 26ML APPLICATOR (WOUND CARE) ×2 IMPLANT
ELECT REM PT RETURN 9FT ADLT (ELECTROSURGICAL) ×2
ELECTRODE REM PT RTRN 9FT ADLT (ELECTROSURGICAL) ×1 IMPLANT
GLOVE BIOGEL PI IND STRL 7.0 (GLOVE) ×2 IMPLANT
GLOVE BIOGEL PI INDICATOR 7.0 (GLOVE) ×2
GLOVE SURG SS PI 7.5 STRL IVOR (GLOVE) ×2 IMPLANT
GOWN STRL REUS W/TWL LRG LVL3 (GOWN DISPOSABLE) ×6 IMPLANT
HEMOSTAT ARISTA ABSORB 3G PWDR (HEMOSTASIS) IMPLANT
HEMOSTAT SNOW SURGICEL 2X4 (HEMOSTASIS) ×2 IMPLANT
INST SET LAPROSCOPIC AP (KITS) ×2 IMPLANT
KIT TURNOVER KIT A (KITS) ×2 IMPLANT
MANIFOLD NEPTUNE II (INSTRUMENTS) ×2 IMPLANT
NEEDLE HYPO 18GX1.5 BLUNT FILL (NEEDLE) ×2 IMPLANT
NEEDLE HYPO 21X1.5 SAFETY (NEEDLE) ×2 IMPLANT
NEEDLE INSUFFLATION 14GA 120MM (NEEDLE) ×2 IMPLANT
NS IRRIG 1000ML POUR BTL (IV SOLUTION) ×2 IMPLANT
PACK LAP CHOLE LZT030E (CUSTOM PROCEDURE TRAY) ×2 IMPLANT
PAD ARMBOARD 7.5X6 YLW CONV (MISCELLANEOUS) ×2 IMPLANT
SET BASIN LINEN APH (SET/KITS/TRAYS/PACK) ×2 IMPLANT
SET TUBE SMOKE EVAC HIGH FLOW (TUBING) ×2 IMPLANT
SLEEVE ENDOPATH XCEL 5M (ENDOMECHANICALS) ×2 IMPLANT
SUT MNCRL AB 4-0 PS2 18 (SUTURE) ×4 IMPLANT
SUT VICRYL 0 UR6 27IN ABS (SUTURE) ×2 IMPLANT
SYR 20ML LL LF (SYRINGE) ×4 IMPLANT
SYS BAG RETRIEVAL 10MM (BASKET) ×1
SYSTEM BAG RETRIEVAL 10MM (BASKET) ×1 IMPLANT
TROCAR ENDO BLADELESS 11MM (ENDOMECHANICALS) ×2 IMPLANT
TROCAR XCEL NON-BLD 5MMX100MML (ENDOMECHANICALS) ×2 IMPLANT
TROCAR XCEL UNIV SLVE 11M 100M (ENDOMECHANICALS) ×2 IMPLANT
TUBE CONNECTING 12X1/4 (SUCTIONS) ×2 IMPLANT
WARMER LAPAROSCOPE (MISCELLANEOUS) ×2 IMPLANT

## 2020-01-13 NOTE — Progress Notes (Signed)
Notified Dr Briant Cedar of bradycardia 50's as low as 46, BP good, pt asx. No new orders.

## 2020-01-13 NOTE — Anesthesia Procedure Notes (Signed)
Procedure Name: Intubation Date/Time: 01/13/2020 7:36 AM Performed by: Orlie Dakin, CRNA Pre-anesthesia Checklist: Patient identified, Emergency Drugs available, Suction available and Patient being monitored Patient Re-evaluated:Patient Re-evaluated prior to induction Oxygen Delivery Method: Circle system utilized Preoxygenation: Pre-oxygenation with 100% oxygen Induction Type: IV induction Ventilation: Mask ventilation without difficulty Laryngoscope Size: Miller and 3 Grade View: Grade II Tube type: Oral Tube size: 7.0 mm Number of attempts: 1 Airway Equipment and Method: Stylet Placement Confirmation: ETT inserted through vocal cords under direct vision,  positive ETCO2 and breath sounds checked- equal and bilateral Secured at: 23 cm Tube secured with: Tape Dental Injury: Teeth and Oropharynx as per pre-operative assessment  Comments: 4x4s bite block used.

## 2020-01-13 NOTE — Interval H&P Note (Signed)
History and Physical Interval Note:  01/13/2020 7:14 AM  Sean Mcfarland  has presented today for surgery, with the diagnosis of Cholelithiasis.  The various methods of treatment have been discussed with the patient and family. After consideration of risks, benefits and other options for treatment, the patient has consented to  Procedure(s): LAPAROSCOPIC CHOLECYSTECTOMY (N/A) as a surgical intervention.  The patient's history has been reviewed, patient examined, no change in status, stable for surgery.  I have reviewed the patient's chart and labs.  Questions were answered to the patient's satisfaction.     Aviva Signs

## 2020-01-13 NOTE — Anesthesia Postprocedure Evaluation (Signed)
Anesthesia Post Note  Patient: Sean Mcfarland  Procedure(s) Performed: LAPAROSCOPIC CHOLECYSTECTOMY (N/A )  Patient location during evaluation: PACU Anesthesia Type: General Level of consciousness: awake and alert and oriented Pain management: pain level controlled Vital Signs Assessment: post-procedure vital signs reviewed and stable Respiratory status: spontaneous breathing, nonlabored ventilation and respiratory function stable Cardiovascular status: blood pressure returned to baseline and stable Postop Assessment: no apparent nausea or vomiting Anesthetic complications: no   No complications documented.   Last Vitals:  Vitals:   01/13/20 0915 01/13/20 0939  BP: (!) 145/69 (!) 152/74  Pulse: (!) 51 (!) 58  Resp: 19 20  Temp:  36.4 C  SpO2: 95% 99%    Last Pain:  Vitals:   01/13/20 0939  TempSrc: Oral  PainSc: [place]

## 2020-01-13 NOTE — Discharge Instructions (Signed)
[facility] THE [facility] [place] [facility] UNTIL Tuesday December 7,2021. DO NOT USE ANY NUMBING MEDICATIONS WITHOUT CONSULTING A PHYSICIAN UNTIL AFTER Tuesday January 17, 2020.     Laparoscopic Cholecystectomy, Care After This sheet gives you information about how to care for yourself after your procedure. Your health care provider may also give you more specific instructions. If you have problems or questions, contact your health care provider. What can I expect after the procedure? After the procedure, it is common to have:  Pain at your incision sites. You will be given medicines to control this pain.  Mild nausea or vomiting.  Bloating and possible shoulder pain from the air-like gas that was used during the procedure. Follow these instructions at home: Incision care   Follow instructions from your health care provider about how to take care of your incisions. Make sure you: ? Wash your hands with soap and water before you change your bandage (dressing). If soap and water are not available, use hand sanitizer. ? Change your dressing as told by your health care provider. ? Leave stitches (sutures), skin glue, or adhesive strips in place. These skin closures may need to be in place for 2 weeks or longer. If adhesive strip edges start to loosen and curl up, you may trim the loose edges. Do not remove adhesive strips completely unless your health care provider tells you to do that.  Do not take baths, swim, or use a hot tub until your health care provider approves. Ask your health care provider if you can take showers. You may only be allowed to take sponge baths for bathing.  Check your incision area every day for signs of infection. Check for: ? More redness, swelling, or pain. ? More fluid or blood. ? Warmth. ? Pus or a bad smell. Activity  Do not drive or use heavy machinery while taking prescription pain medicine.  Do not lift anything that is heavier than 10 lb (4.5 kg)  until your health care provider approves.  Do not play contact sports until your health care provider approves.  Do not drive for 24 hours if you were given a medicine to help you relax (sedative).  Rest as needed. Do not return to work or school until your health care provider approves. General instructions  Take over-the-counter and prescription medicines only as told by your health care provider.  To prevent or treat constipation while you are taking prescription pain medicine, your health care provider may recommend that you: ? Drink enough fluid to keep your urine clear or pale yellow. ? Take over-the-counter or prescription medicines. ? Eat foods that are high in fiber, such as fresh fruits and vegetables, whole grains, and beans. ? Limit foods that are high in fat and processed sugars, such as fried and sweet foods. Contact a health care provider if:  You develop a rash.  You have more redness, swelling, or pain around your incisions.  You have more fluid or blood coming from your incisions.  Your incisions feel warm to the touch.  You have pus or a bad smell coming from your incisions.  You have a fever.  One or more of your incisions breaks open. Get help right away if:  You have trouble breathing.  You have chest pain.  You have increasing pain in your shoulders.  You faint or feel dizzy when you stand.  You have severe pain in your abdomen.  You have nausea or vomiting that lasts for more than  one day.  You have leg pain. This information is not intended to replace advice given to you by your health care provider. Make sure you discuss any questions you have with your health care provider. Document Revised: 01/09/2017 Document Reviewed: 07/16/2015 Elsevier Patient Education  2020 [place] Anesthesia, Adult, Care After This sheet gives you information about how to care for yourself after your procedure. Your health care provider may  also give you more specific instructions. If you have problems or questions, contact your health care provider. What can I expect after the procedure? After the procedure, the following side effects are common:  Pain or discomfort at the IV site.  Nausea.  Vomiting.  Sore throat.  Trouble concentrating.  Feeling cold or chills.  Weak or tired.  Sleepiness and fatigue.  Soreness and body aches. These side effects can affect parts of the body that were not involved in surgery. Follow these instructions at home:  For at least 24 hours after the procedure:  Have a responsible adult stay with you. It is important to have someone help care for you until you are awake and alert.  Rest as needed.  Do not: ? Participate in activities in which you could fall or become injured. ? Drive. ? Use heavy machinery. ? Drink alcohol. ? Take sleeping pills or medicines that cause drowsiness. ? Make important decisions or sign legal documents. ? Take care of children on your own. Eating and drinking  Follow any instructions from your health care provider about eating or drinking restrictions.  When you feel hungry, start by eating small amounts of foods that are soft and easy to digest (bland), such as toast. Gradually return to your regular diet.  Drink enough fluid to keep your urine pale yellow.  If you vomit, rehydrate by drinking water, juice, or clear broth. General instructions  If you have sleep apnea, surgery and certain medicines can increase your risk for breathing problems. Follow instructions from your health care provider about wearing your sleep device: ? Anytime you are sleeping, including during daytime naps. ? While taking prescription pain medicines, sleeping medicines, or medicines that make you drowsy.  Return to your normal activities as told by your health care provider. Ask your health care provider what activities are safe for you.  Take over-the-counter and  prescription medicines only as told by your health care provider.  If you smoke, do not smoke without supervision.  Keep all follow-up visits as told by your health care provider. This is important. Contact a health care provider if:  You have nausea or vomiting that does not get better with medicine.  You cannot eat or drink without vomiting.  You have pain that does not get better with medicine.  You are unable to pass urine.  You develop a skin rash.  You have a fever.  You have redness around your IV site that gets worse. Get help right away if:  You have difficulty breathing.  You have chest pain.  You have blood in your urine or stool, or you vomit blood. Summary  After the procedure, it is common to have a sore throat or nausea. It is also common to feel tired.  Have a responsible adult stay with you for the first 24 hours after general anesthesia. It is important to have someone help care for you until you are awake and alert.  When you feel hungry, start by eating small amounts of foods that  are soft and easy to digest (bland), such as toast. Gradually return to your regular diet.  Drink enough fluid to keep your urine pale yellow.  Return to your normal activities as told by your health care provider. Ask your health care provider what activities are safe for you. This information is not intended to replace advice given to you by your health care provider. Make sure you discuss any questions you have with your health care provider. Document Revised: 01/30/2017 Document Reviewed: 09/12/2016 Elsevier Patient Education  [place].

## 2020-01-13 NOTE — Op Note (Signed)
Patient:  Sean Mcfarland  DOB:  1940/09/13  MRN:  [id number]   Preop Diagnosis: Cholelithiasis, history of choledocholithiasis  Postop Diagnosis: Same  Procedure: Laparoscopic cholecystectomy  Surgeon: Aviva Signs, MD  Anes: General endotracheal  Indications: Patient is an 78 year old white male status post ERCP for choledocholithiasis who now presents for laparoscopic cholecystectomy.  The risks and benefits of the procedure including bleeding, infection, hepatobiliary injury, the possibility of an open procedure were fully explained to the patient, who gave informed consent.  Procedure note: The patient was placed in the supine position.  After induction of general endotracheal anesthesia, the abdomen was prepped and draped using the usual sterile technique with DuraPrep.  Surgical site confirmation was performed.  A supraumbilical incision was made down to the fascia.  A Veress needle was introduced into the abdominal cavity and confirmation of placement was done using the saline drop test.  The abdomen was then insufflated to 15 mmHg pressure.  An 11 mm trocar was introduced into the abdominal cavity under direct visualization without difficulty.  The patient was placed in reverse Trendelenburg position and an additional 11 mm trocar was placed in the epigastric region and 5 mm trochars were placed the right upper quadrant and right flank regions.  Liver was inspected and noted to be within normal limits.  The gallbladder was retracted in a dynamic fashion in order to provide a critical view of the triangle of [place].  The cystic duct was first identified.  Its juncture to the infundibulum was fully identified.  Endoclips were placed proximally and distally on the cystic duct, and the cystic duct was divided.  This was likewise done and the cystic artery.  The gallbladder was freed away from the gallbladder fossa using Bovie electrocautery.  The gallbladder was delivered through the epigastric  trocar site using Endo Catch bag.  The gallbladder fossa was inspected and no abnormal bleeding or bile leakage was noted.  Surgicel was placed in the gallbladder fossa.  All fluid and air were then evacuated from the abdominal cavity prior to removal of the trochars.  All wounds were irrigated with normal saline.  All wounds were injected with Exparel.  The supraumbilical fascia as well as epigastric fascia were reapproximated using 0 Vicryl interrupted sutures.  All skin incisions were closed using 4-0 Monocryl subcuticular sutures.  Dermabond was applied.  All tape and needle counts were correct at the end of the procedure.  The patient was extubated in the operating room and transferred to PACU in stable condition.  Complications: None  EBL: Minimal  Specimen: Gallbladder

## 2020-01-13 NOTE — Transfer of Care (Signed)
Immediate Anesthesia Transfer of Care Note  Patient: Sean Mcfarland  Procedure(s) Performed: LAPAROSCOPIC CHOLECYSTECTOMY (N/A )  Patient Location: PACU  Anesthesia Type:General  Level of Consciousness: awake and alert   Airway & Oxygen Therapy: Patient Spontanous Breathing  Post-op Assessment: Report given to RN and Post -op Vital signs reviewed and stable  Post vital signs: Reviewed and stable  Last Vitals:  Vitals Value Taken Time  BP 144/80 01/13/20 0835  Temp    Pulse 53 01/13/20 0838  Resp 17 01/13/20 0838  SpO2 100 % 01/13/20 0838  Vitals shown include unvalidated device data.  Last Pain:  Vitals:   01/13/20 0646  TempSrc: Oral  PainSc: 0-No pain      Patients Stated Pain Goal: 6 (15/05/69 7948)  Complications: No complications documented.

## 2020-01-13 NOTE — Anesthesia Preprocedure Evaluation (Addendum)
Anesthesia Evaluation  Patient identified by MRN, date of birth, ID band Patient awake    Reviewed: Allergy & Precautions, H&P , NPO status , Patient's Chart, lab work & pertinent test results, reviewed documented beta blocker date and time   Airway Mallampati: II  TM Distance: >3 FB Neck ROM: full    Dental no notable dental hx. (+) Teeth Intact, Dental Advisory Given   Pulmonary neg pulmonary ROS,    Pulmonary exam normal breath sounds clear to auscultation       Cardiovascular Exercise Tolerance: Good hypertension, negative cardio ROS   Rhythm:regular Rate:Normal     Neuro/Psych negative neurological ROS  negative psych ROS   GI/Hepatic negative GI ROS, Neg liver ROS,   Endo/Other  negative endocrine ROSdiabetes  Renal/GU negative Renal ROS  negative genitourinary   Musculoskeletal   Abdominal   Peds  Hematology  (+) Blood dyscrasia, anemia ,   Anesthesia Other Findings   Reproductive/Obstetrics negative OB ROS                            Anesthesia Physical  Anesthesia Plan  ASA: II  Anesthesia Plan: General   Post-op Pain Management:    Induction:   PONV Risk Score and Plan: Ondansetron  Airway Management Planned:   Additional Equipment:   Intra-op Plan:   Post-operative Plan:   Informed Consent: I have reviewed the patients History and Physical, chart, labs and discussed the procedure including the risks, benefits and alternatives for the proposed anesthesia with the patient or authorized representative who has indicated his/her understanding and acceptance.     Dental Advisory Given  Plan Discussed with: CRNA  Anesthesia Plan Comments:         Anesthesia Quick Evaluation

## 2020-01-16 LAB — SURGICAL PATHOLOGY

## 2020-01-17 ENCOUNTER — Encounter (HOSPITAL_COMMUNITY): Payer: Self-pay | Admitting: General Surgery

## 2020-01-19 ENCOUNTER — Telehealth (INDEPENDENT_AMBULATORY_CARE_PROVIDER_SITE_OTHER): Payer: Medicare Other | Admitting: General Surgery

## 2020-01-19 DIAGNOSIS — Z09 Encounter for follow-up examination after completed treatment for conditions other than malignant neoplasm: Secondary | ICD-10-CM

## 2020-01-19 NOTE — Progress Notes (Signed)
Subjective:     Sean Mcfarland  Virtual postoperative telephone visit performed with patient.  He was on his cell phone and I was in the office.  Consent was given for the visit.  Patient states he is doing well.  He is increasing his activity as able.  He denies any fever or chills. Objective:    There were no vitals taken for this visit.  General:  alert and cooperative  Final pathology consistent with diagnosis.     Assessment:    Doing well postoperatively.    Plan:   Patient was told to increase his activity as able.  He will call here if he has any further questions.  Total telephone time was 3 minutes.  This visit was a part of the global surgical fee and is not billable.

## 2020-01-23 ENCOUNTER — Other Ambulatory Visit: Payer: Self-pay

## 2020-01-23 DIAGNOSIS — R945 Abnormal results of liver function studies: Secondary | ICD-10-CM

## 2020-01-23 DIAGNOSIS — R932 Abnormal findings on diagnostic imaging of liver and biliary tract: Secondary | ICD-10-CM

## 2020-01-23 DIAGNOSIS — R7989 Other specified abnormal findings of blood chemistry: Secondary | ICD-10-CM

## 2020-03-15 ENCOUNTER — Telehealth: Payer: Self-pay | Admitting: Internal Medicine

## 2020-03-15 NOTE — Telephone Encounter (Signed)
Spoke with pts daughter. Pt walked in as I was speaking with pts daughter on the phone. Pt left a copy of the [facility] bill which is $153.00. Pt received a bill stating the codes given for lab work was denied payment by [facility].   I called [facility]  at [phone] to discuss the billing issue. Spoke with pts daughter and spouse, new codes were submitted to [employer] via phone. Per the Rep Vaughan Basta, it may take 30-40 business days for the billing cycle to change. The rep said most of the time it takes less than 30 days.   Pts spouse and daughter were asked to give me a call if they have any more questions or concerns.

## 2020-03-15 NOTE — Telephone Encounter (Signed)
Patient son, Monica Martinez, [id number] or [phone] called with questiions about the coding for his labs

## 2020-03-22 ENCOUNTER — Ambulatory Visit: Payer: Medicare Other | Admitting: Gastroenterology

## 2020-04-25 DIAGNOSIS — H40033 Anatomical narrow angle, bilateral: Secondary | ICD-10-CM | POA: Diagnosis not present

## 2020-05-01 DIAGNOSIS — E785 Hyperlipidemia, unspecified: Secondary | ICD-10-CM | POA: Diagnosis not present

## 2020-05-01 DIAGNOSIS — E1165 Type 2 diabetes mellitus with hyperglycemia: Secondary | ICD-10-CM | POA: Diagnosis not present

## 2020-05-01 DIAGNOSIS — Z0189 Encounter for other specified special examinations: Secondary | ICD-10-CM | POA: Diagnosis not present

## 2020-05-01 DIAGNOSIS — I10 Essential (primary) hypertension: Secondary | ICD-10-CM | POA: Diagnosis not present

## 2020-05-01 DIAGNOSIS — Z712 Person consulting for explanation of examination or test findings: Secondary | ICD-10-CM | POA: Diagnosis not present

## 2020-05-03 DIAGNOSIS — E785 Hyperlipidemia, unspecified: Secondary | ICD-10-CM | POA: Diagnosis not present

## 2020-05-03 DIAGNOSIS — I10 Essential (primary) hypertension: Secondary | ICD-10-CM | POA: Diagnosis not present

## 2020-05-03 DIAGNOSIS — E1165 Type 2 diabetes mellitus with hyperglycemia: Secondary | ICD-10-CM | POA: Diagnosis not present

## 2020-05-03 DIAGNOSIS — R945 Abnormal results of liver function studies: Secondary | ICD-10-CM | POA: Diagnosis not present

## 2020-08-08 ENCOUNTER — Telehealth: Payer: Self-pay | Admitting: Family Medicine

## 2020-08-08 ENCOUNTER — Ambulatory Visit
Admission: EM | Admit: 2020-08-08 | Discharge: 2020-08-08 | Disposition: A | Discharge status: Home / Self Care | Payer: Medicare Other | Attending: Family Medicine | Admitting: Family Medicine

## 2020-08-08 ENCOUNTER — Other Ambulatory Visit: Payer: Self-pay

## 2020-08-08 ENCOUNTER — Encounter: Payer: Self-pay | Admitting: Emergency Medicine

## 2020-08-08 DIAGNOSIS — L03115 Cellulitis of right lower limb: Secondary | ICD-10-CM

## 2020-08-08 DIAGNOSIS — M79604 Pain in right leg: Secondary | ICD-10-CM

## 2020-08-08 MED ORDER — SULFAMETHOXAZOLE-TRIMETHOPRIM 800-160 MG PO TABS: 1.0000 | ORAL_TABLET | Freq: Two times a day (BID) | ORAL | 0 refills | Status: AC

## 2020-08-08 MED ORDER — AMOXICILLIN-POT CLAVULANATE 875-125 MG PO TABS: 1.0000 | ORAL_TABLET | Freq: Two times a day (BID) | ORAL | 0 refills | Status: AC

## 2020-08-08 MED ORDER — TETANUS-DIPHTH-ACELL PERTUSSIS 5-2.5-18.5 LF-MCG/0.5 IM SUSY
0.5000 mL | PREFILLED_SYRINGE | Freq: Once | INTRAMUSCULAR | Status: AC
  Administered 2020-08-08: 09:00:00 0.5 mL via INTRAMUSCULAR

## 2020-08-08 NOTE — ED Provider Notes (Signed)
[place]   [id number] 08/08/20 Arrival Time: 7253  CC: RASH  SUBJECTIVE:  Sean Mcfarland is a 80 y.o. male who presents with a skin complaint that began about a week ago. Reports that he hit his leg on something metal last week. Cannot recall last tetanus vaccine. Denies medications change or starting a new medication recently. Localizes wound to anterior right lower leg. Describes it as erythematous, swollen, tender and warm to touch. Has not attempted OTC treatment. There are no aggravating or alleviating factors. Denies similar symptoms in the past. Denies fever, chills, nausea, vomiting, discharge, oral lesions, SOB, chest pain, abdominal pain, changes in bowel or bladder function.    ROS: As per HPI.  All other pertinent ROS negative.     Past Medical History:  Diagnosis Date   Complication of anesthesia    urinary retention after ERCP   Diabetes mellitus without complication ([place])    diet controlled   Hypertension    Past Surgical History:  Procedure Laterality Date   CHOLECYSTECTOMY N/A 01/13/2020   Procedure: LAPAROSCOPIC CHOLECYSTECTOMY;  Surgeon: Aviva Signs, MD;  Location: [facility];  Service: General;  Laterality: N/A;   ERCP N/A 12/02/2019   Procedure: ENDOSCOPIC RETROGRADE CHOLANGIOPANCREATOGRAPHY (ERCP);  Surgeon: Rogene Houston, MD; Dilated CBD and CHD s/p biliary sphincterotomy and large stone removal with balloon stone extractor.   REMOVAL OF STONES N/A 12/02/2019   Procedure: REMOVAL OF STONES;  Surgeon: Rogene Houston, MD;  Location: [facility];  Service: Endoscopy;  Laterality: N/A;   SPHINCTEROTOMY N/A 12/02/2019   Procedure: SPHINCTEROTOMY;  Surgeon: Rogene Houston, MD;  Location: [facility];  Service: Endoscopy;  Laterality: N/A;   No Known Allergies No current facility-administered medications on file prior to encounter.   Current Outpatient Medications on File Prior to Encounter  Medication Sig Dispense Refill   Ascorbic Acid  (VITAMIN C) 500 MG CAPS Take 500 mg by mouth daily.     benazepril (LOTENSIN) 20 MG tablet Take 1 tablet (20 mg total) by mouth daily.     Cinnamon 500 MG capsule Take 500 mg by mouth daily.     dorzolamide-timolol (COSOPT) 22.3-6.8 MG/ML ophthalmic solution Place 1 drop into both eyes in the morning and at bedtime.     Green Tea 315 MG CAPS Take 315 mg by mouth daily.     Multiple Vitamin (MULTIVITAMIN WITH MINERALS) TABS tablet Take 1 tablet by mouth daily.     Omega-3 Fatty Acids (FISH OIL) 1000 MG CAPS Take 1,000 mg by mouth daily.     traMADol (ULTRAM) 50 MG tablet Take 1 tablet (50 mg total) by mouth every 6 (six) hours as needed. 25 tablet 0   vitamin E 180 MG (400 UNITS) capsule Take 400 Units by mouth daily.     Social History   Socioeconomic History   Marital status: Married    Spouse name: Not on file   Number of children: Not on file   Years of education: Not on file   Highest education level: Not on file  Occupational History   Not on file  Tobacco Use   Smoking status: Never   Smokeless tobacco: Never  Vaping Use   Vaping Use: Never used  Substance and Sexual Activity   Alcohol use: Never   Drug use: Never   Sexual activity: Yes  Other Topics Concern   Not on file  Social History Narrative   Not on file   Social Determinants of Health  Financial Resource Strain: Not on file  Food Insecurity: Not on file  Transportation Needs: Not on file  Physical Activity: Not on file  Stress: Not on file  Social Connections: Not on file  Intimate Partner Violence: Not on file   Family History  Problem Relation Age of Onset   Healthy Mother    Healthy Father 17       ?stroke   Breast cancer Daughter        stage 4   Colon cancer Son        age 65s   Liver disease Neg Hx     OBJECTIVE: Vitals:   08/08/20 0846  BP: (!) 154/85  Pulse: 60  Resp: 18  Temp: (!) 97.2 F (36.2 C)  TempSrc: Tympanic  SpO2: 98%    General appearance: alert; no distress Head:  NCAT Lungs: clear to auscultation bilaterally Heart: regular rate and rhythm.  Radial pulse 2+ bilaterally Extremities: no edema Skin: warm and dry; 3cm area of erythema, swelling, tenderness, warmth to touch with open wound to the anterior right lower leg Psychological: alert and cooperative; normal mood and affect  ASSESSMENT & PLAN:  1. Cellulitis of leg, right   2. Right leg pain     Meds ordered this encounter  Medications   Tdap (BOOSTRIX) injection 0.5 mL   sulfamethoxazole-trimethoprim (BACTRIM DS) 800-160 MG tablet    Sig: Take 1 tablet by mouth 2 (two) times daily for 7 days.    Dispense:  14 tablet    Refill:  0    Order Specific Question:   Supervising Provider    Answer:   Chase Picket [[id number]]    Tdap vaccine updated in office today Prescribed Bactrim BID x 7 days Take as prescribed and to completion Avoid hot showers/ baths Moisturize skin daily  Follow up with PCP if symptoms persists Return or go to the ER if you have any new or worsening symptoms such as fever, chills, nausea, vomiting, redness, swelling, discharge, if symptoms do not improve with medications  Reviewed expectations re: course of current medical issues. Questions answered. Outlined signs and symptoms indicating need for more acute intervention. Patient verbalized understanding. After Visit Summary given.    Faustino Congress, NP 08/08/20 [phone]

## 2020-08-08 NOTE — Telephone Encounter (Signed)
Called and would like abx changed. Afraid to take Bactrim with BP meds. Prescribed Augmentin 875mg  BID x 7 days.

## 2020-08-08 NOTE — Discharge Instructions (Addendum)
I have sent in Bactrim for you to take twice a day for 7 days.  We have updated your Tdap vaccine in the office toda  Follow up with this office or with primary care for increased swelling, redness, tenderness, warmth, drainage from the area.  Follow up in the ER for red streaking up your leg, high fever, trouble swallowing, trouble breathing, other concerning symptoms.

## 2020-08-08 NOTE — ED Triage Notes (Signed)
Wound to RT shin x over a week from a piece of metal. Area is red and swollen.

## 2020-09-04 DIAGNOSIS — E1165 Type 2 diabetes mellitus with hyperglycemia: Secondary | ICD-10-CM | POA: Diagnosis not present

## 2020-09-04 DIAGNOSIS — Z0000 Encounter for general adult medical examination without abnormal findings: Secondary | ICD-10-CM | POA: Diagnosis not present

## 2020-09-04 DIAGNOSIS — I10 Essential (primary) hypertension: Secondary | ICD-10-CM | POA: Diagnosis not present

## 2020-09-06 DIAGNOSIS — E1165 Type 2 diabetes mellitus with hyperglycemia: Secondary | ICD-10-CM | POA: Diagnosis not present

## 2020-09-06 DIAGNOSIS — Z0001 Encounter for general adult medical examination with abnormal findings: Secondary | ICD-10-CM | POA: Diagnosis not present

## 2020-09-06 DIAGNOSIS — E782 Mixed hyperlipidemia: Secondary | ICD-10-CM | POA: Diagnosis not present

## 2020-09-06 DIAGNOSIS — I10 Essential (primary) hypertension: Secondary | ICD-10-CM | POA: Diagnosis not present

## 2020-09-06 DIAGNOSIS — R945 Abnormal results of liver function studies: Secondary | ICD-10-CM | POA: Diagnosis not present

## 2020-09-17 DIAGNOSIS — M9903 Segmental and somatic dysfunction of lumbar region: Secondary | ICD-10-CM | POA: Diagnosis not present

## 2020-09-17 DIAGNOSIS — M5136 Other intervertebral disc degeneration, lumbar region: Secondary | ICD-10-CM | POA: Diagnosis not present

## 2020-09-20 DIAGNOSIS — M9903 Segmental and somatic dysfunction of lumbar region: Secondary | ICD-10-CM | POA: Diagnosis not present

## 2020-09-20 DIAGNOSIS — M5136 Other intervertebral disc degeneration, lumbar region: Secondary | ICD-10-CM | POA: Diagnosis not present

## 2020-09-24 DIAGNOSIS — M5136 Other intervertebral disc degeneration, lumbar region: Secondary | ICD-10-CM | POA: Diagnosis not present

## 2020-09-24 DIAGNOSIS — M9903 Segmental and somatic dysfunction of lumbar region: Secondary | ICD-10-CM | POA: Diagnosis not present

## 2020-09-26 DIAGNOSIS — M5136 Other intervertebral disc degeneration, lumbar region: Secondary | ICD-10-CM | POA: Diagnosis not present

## 2020-09-26 DIAGNOSIS — M9903 Segmental and somatic dysfunction of lumbar region: Secondary | ICD-10-CM | POA: Diagnosis not present

## 2020-10-01 DIAGNOSIS — M5136 Other intervertebral disc degeneration, lumbar region: Secondary | ICD-10-CM | POA: Diagnosis not present

## 2020-10-01 DIAGNOSIS — M9903 Segmental and somatic dysfunction of lumbar region: Secondary | ICD-10-CM | POA: Diagnosis not present

## 2020-10-03 DIAGNOSIS — M9903 Segmental and somatic dysfunction of lumbar region: Secondary | ICD-10-CM | POA: Diagnosis not present

## 2020-10-03 DIAGNOSIS — M5136 Other intervertebral disc degeneration, lumbar region: Secondary | ICD-10-CM | POA: Diagnosis not present

## 2020-10-08 DIAGNOSIS — M9903 Segmental and somatic dysfunction of lumbar region: Secondary | ICD-10-CM | POA: Diagnosis not present

## 2020-10-08 DIAGNOSIS — M5136 Other intervertebral disc degeneration, lumbar region: Secondary | ICD-10-CM | POA: Diagnosis not present

## 2020-10-10 DIAGNOSIS — M5136 Other intervertebral disc degeneration, lumbar region: Secondary | ICD-10-CM | POA: Diagnosis not present

## 2020-10-10 DIAGNOSIS — M9903 Segmental and somatic dysfunction of lumbar region: Secondary | ICD-10-CM | POA: Diagnosis not present

## 2020-12-10 DIAGNOSIS — Z125 Encounter for screening for malignant neoplasm of prostate: Secondary | ICD-10-CM | POA: Diagnosis not present

## 2020-12-10 DIAGNOSIS — I10 Essential (primary) hypertension: Secondary | ICD-10-CM | POA: Diagnosis not present

## 2020-12-10 DIAGNOSIS — R7301 Impaired fasting glucose: Secondary | ICD-10-CM | POA: Diagnosis not present

## 2020-12-17 ENCOUNTER — Encounter: Payer: Self-pay | Admitting: Emergency Medicine

## 2020-12-17 ENCOUNTER — Other Ambulatory Visit: Payer: Self-pay

## 2020-12-17 ENCOUNTER — Ambulatory Visit: Payer: Medicare Other

## 2020-12-17 ENCOUNTER — Ambulatory Visit (INDEPENDENT_AMBULATORY_CARE_PROVIDER_SITE_OTHER): Payer: Medicare Other

## 2020-12-17 ENCOUNTER — Ambulatory Visit
Admission: EM | Admit: 2020-12-17 | Discharge: 2020-12-17 | Disposition: A | Discharge status: Home / Self Care | Payer: Medicare Other | Attending: Urgent Care | Admitting: Urgent Care

## 2020-12-17 DIAGNOSIS — J029 Acute pharyngitis, unspecified: Secondary | ICD-10-CM | POA: Diagnosis not present

## 2020-12-17 DIAGNOSIS — J209 Acute bronchitis, unspecified: Secondary | ICD-10-CM

## 2020-12-17 DIAGNOSIS — R0981 Nasal congestion: Secondary | ICD-10-CM

## 2020-12-17 DIAGNOSIS — R079 Chest pain, unspecified: Secondary | ICD-10-CM | POA: Diagnosis not present

## 2020-12-17 DIAGNOSIS — K449 Diaphragmatic hernia without obstruction or gangrene: Secondary | ICD-10-CM | POA: Diagnosis not present

## 2020-12-17 DIAGNOSIS — R059 Cough, unspecified: Secondary | ICD-10-CM | POA: Diagnosis not present

## 2020-12-17 DIAGNOSIS — R0982 Postnasal drip: Secondary | ICD-10-CM

## 2020-12-17 DIAGNOSIS — R070 Pain in throat: Secondary | ICD-10-CM

## 2020-12-17 MED ORDER — PREDNISONE 10 MG PO TABS: 30.0000 mg | ORAL_TABLET | Freq: Every day | ORAL | 0 refills | Status: AC

## 2020-12-17 MED ORDER — PROMETHAZINE-DM 6.25-15 MG/5ML PO SYRP: 5.0000 mL | ORAL_SOLUTION | Freq: Every evening | ORAL | 0 refills | Status: AC | PRN

## 2020-12-17 MED ORDER — PSEUDOEPHEDRINE HCL 30 MG PO TABS: 30.0000 mg | ORAL_TABLET | Freq: Three times a day (TID) | ORAL | 0 refills | Status: AC | PRN

## 2020-12-17 MED ORDER — CETIRIZINE HCL 5 MG PO TABS: 5.0000 mg | ORAL_TABLET | Freq: Every day | ORAL | 0 refills | Status: AC

## 2020-12-17 NOTE — ED Provider Notes (Signed)
[place]   MRN: [id number] DOB: [birth year]  Subjective:   Sean Mcfarland is a 80 y.o. male presenting for 2-day history of acute onset throat pain, sinus congestion, sinus drainage, coughing.  Does not want a COVID or flu test.  No chest pain, shortness of breath, body aches.  No history of asthma, respiratory disorders.  Patient denies smoking history.  No current facility-administered medications for this encounter.  Current Outpatient Medications:    Ascorbic Acid (VITAMIN C) 500 MG CAPS, Take 500 mg by mouth daily., Disp: , Rfl:    benazepril (LOTENSIN) 20 MG tablet, Take 1 tablet (20 mg total) by mouth daily., Disp: , Rfl:    Cinnamon 500 MG capsule, Take 500 mg by mouth daily., Disp: , Rfl:    dorzolamide-timolol (COSOPT) 22.3-6.8 MG/ML ophthalmic solution, Place 1 drop into both eyes in the morning and at bedtime., Disp: , Rfl:    Green Tea 315 MG CAPS, Take 315 mg by mouth daily., Disp: , Rfl:    Multiple Vitamin (MULTIVITAMIN WITH MINERALS) TABS tablet, Take 1 tablet by mouth daily., Disp: , Rfl:    Omega-3 Fatty Acids (FISH OIL) 1000 MG CAPS, Take 1,000 mg by mouth daily., Disp: , Rfl:    traMADol (ULTRAM) 50 MG tablet, Take 1 tablet (50 mg total) by mouth every 6 (six) hours as needed., Disp: 25 tablet, Rfl: 0   vitamin E 180 MG (400 UNITS) capsule, Take 400 Units by mouth daily., Disp: , Rfl:    No Known Allergies  Past Medical History:  Diagnosis Date   Complication of anesthesia    urinary retention after ERCP   Diabetes mellitus without complication ([place])    diet controlled   Hypertension      Past Surgical History:  Procedure Laterality Date   CHOLECYSTECTOMY N/A 01/13/2020   Procedure: LAPAROSCOPIC CHOLECYSTECTOMY;  Surgeon: Aviva Signs, MD;  Location: [facility];  Service: General;  Laterality: N/A;   ERCP N/A 12/02/2019   Procedure: ENDOSCOPIC RETROGRADE CHOLANGIOPANCREATOGRAPHY (ERCP);  Surgeon: Rogene Houston, MD; Dilated CBD and CHD s/p  biliary sphincterotomy and large stone removal with balloon stone extractor.   REMOVAL OF STONES N/A 12/02/2019   Procedure: REMOVAL OF STONES;  Surgeon: Rogene Houston, MD;  Location: [facility];  Service: Endoscopy;  Laterality: N/A;   SPHINCTEROTOMY N/A 12/02/2019   Procedure: SPHINCTEROTOMY;  Surgeon: Rogene Houston, MD;  Location: [facility];  Service: Endoscopy;  Laterality: N/A;    Family History  Problem Relation Age of Onset   Healthy Mother    Healthy Father 41       ?stroke   Breast cancer Daughter        stage 5   Colon cancer Son        age 85s   Liver disease Neg Hx     Social History   Tobacco Use   Smoking status: Never   Smokeless tobacco: Never  Vaping Use   Vaping Use: Never used  Substance Use Topics   Alcohol use: Never   Drug use: Never    ROS   Objective:   Vitals: BP 119/68 (BP Location: Right Arm)   Pulse 72   Temp 99 F (37.2 C) (Oral)   Resp 16   SpO2 95%   Physical Exam Constitutional:      General: He is not in acute distress.    Appearance: Normal appearance. He is well-developed and normal weight. He is not ill-appearing, toxic-appearing or  diaphoretic.  HENT:     Head: Normocephalic and atraumatic.     Right Ear: Tympanic membrane, ear canal and external ear normal. There is no impacted cerumen.     Left Ear: Tympanic membrane, ear canal and external ear normal. There is no impacted cerumen.     Nose: Nose normal. No congestion or rhinorrhea.     Mouth/Throat:     Mouth: Mucous membranes are moist.     Pharynx: No pharyngeal swelling, oropharyngeal exudate, posterior oropharyngeal erythema or uvula swelling.     Tonsils: No tonsillar exudate or tonsillar abscesses. 0 on the right. 0 on the left.     Comments: Hoarseness of the voice. Eyes:     General: No scleral icterus.       Right eye: No discharge.        Left eye: No discharge.     Extraocular Movements: Extraocular movements intact.     Conjunctiva/sclera:  Conjunctivae normal.     Pupils: Pupils are equal, round, and reactive to light.  Cardiovascular:     Rate and Rhythm: Normal rate and regular rhythm.     Heart sounds: Normal heart sounds. No murmur heard.   No friction rub. No gallop.  Pulmonary:     Effort: Pulmonary effort is normal. No respiratory distress.     Breath sounds: No stridor. No wheezing, rhonchi or rales.     Comments: Decreased lung sounds in bibasilar fields. Musculoskeletal:     Cervical back: Normal range of motion and neck supple. No rigidity. No muscular tenderness.  Neurological:     General: No focal deficit present.     Mental Status: He is alert and oriented to person, place, and time.  Psychiatric:        Mood and Affect: Mood normal.        Behavior: Behavior normal.        Thought Content: Thought content normal.    DG Chest 2 View  Result Date: 12/17/2020 CLINICAL DATA:  Chest congestion, cough and pharyngitis for the past 2 days. EXAM: CHEST - 2 VIEW COMPARISON:  10/03/2015 FINDINGS: Normal sized heart. Clear lungs. Mild diffuse peribronchial thickening with mild progression. Stable moderate-sized hiatal hernia. Cholecystectomy clips. IMPRESSION: Mild bronchitic changes with mild progression. Electronically Signed   By: Claudie Revering M.D.   On: 12/17/2020 17:05     Assessment and Plan :   PDMP not reviewed this encounter.  1. Acute bronchitis, unspecified organism   2. Throat pain   3. Sinus congestion   4. Post-nasal drainage    Patient refused COVID and flu test.  Recommended using supportive care, prednisone to help with this acute bronchitis.  Use pseudoephedrine after he is finished with the prednisone. Counseled patient on potential for adverse effects with medications prescribed/recommended today, ER and return-to-clinic precautions discussed, patient verbalized understanding.    Jaynee Eagles, PA-C 12/17/20 1718

## 2020-12-17 NOTE — ED Triage Notes (Signed)
PT has been working outside a lot lately.   Reports congestion and sore throat that started 2 days ago.

## 2021-01-02 DIAGNOSIS — J069 Acute upper respiratory infection, unspecified: Secondary | ICD-10-CM | POA: Diagnosis not present

## 2021-01-10 DIAGNOSIS — I10 Essential (primary) hypertension: Secondary | ICD-10-CM | POA: Diagnosis not present

## 2021-01-10 DIAGNOSIS — J069 Acute upper respiratory infection, unspecified: Secondary | ICD-10-CM | POA: Diagnosis not present

## 2021-01-10 DIAGNOSIS — R945 Abnormal results of liver function studies: Secondary | ICD-10-CM | POA: Diagnosis not present

## 2021-01-10 DIAGNOSIS — E782 Mixed hyperlipidemia: Secondary | ICD-10-CM | POA: Diagnosis not present

## 2021-01-10 DIAGNOSIS — E1165 Type 2 diabetes mellitus with hyperglycemia: Secondary | ICD-10-CM | POA: Diagnosis not present

## 2021-02-22 IMAGING — MR MR ABDOMEN WO/W CM
20 series · 47 of 48 positions shown · IV contrast (Gadavist)
Comparison: Right upper quadrant ultrasound, 10/11/2019
COMPARISON: Right upper quadrant ultrasound, 10/11/2019

Addendum:
CLINICAL DATA: Elevated LFT, abnormal ultrasound, lesion in the
right lobe of the liver

EXAM:
MRI ABDOMEN WITHOUT AND WITH CONTRAST
TECHNIQUE: Multiplanar multisequence MR imaging of the abdomen was performed
both before and after the administration of intravenous contrast.
CONTRAST:  7mL GADAVIST GADOBUTROL 1 MMOL/ML IV SOLN

[Series 3: cor haste · coronal · 6.0mm · 1.25mm/px · 3 of 36 slices shown]
[im 1/36]
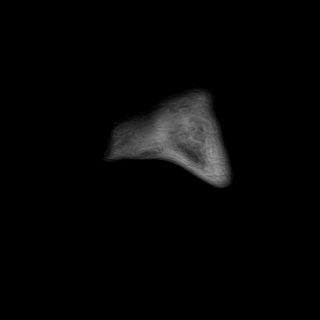
[im 18/36]
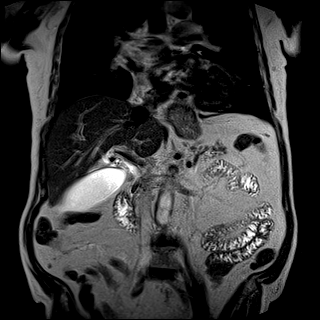
[im 36/36]
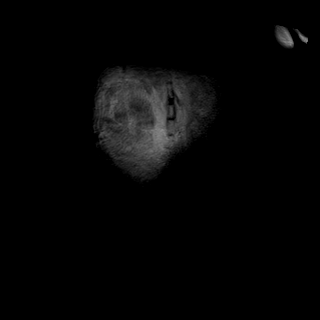

[Series 4: ax haste · axial · 6.0mm · 1.19mm/px · z∈[-154,+84]mm · 2 of 34 slices shown]
[im 1/34]
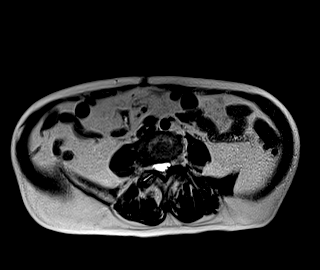
[im 34/34]
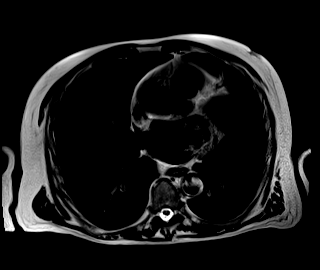

[Series 5: T2 fat-sat · axial · 6.0mm · 1.19mm/px · z∈[-124,+85]mm · 2 of 30 slices shown]
[im 1/30]
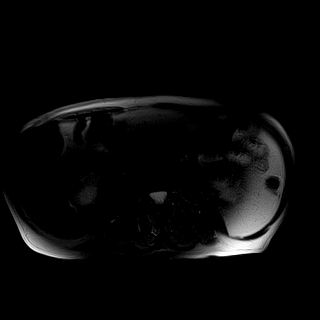
[im 30/30]
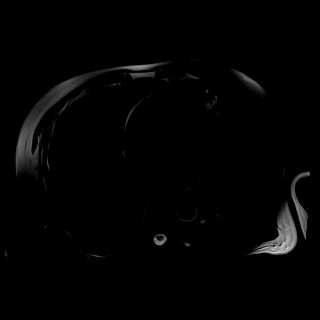

[Series 6: DWI · axial · 6.0mm · 1.42mm/px · 1 of 30 slices shown (1 of 4)]
[im 1/30]
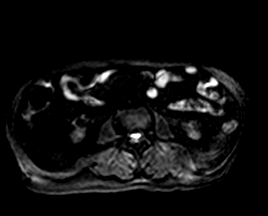

[Series 6: DWI · axial · 6.0mm · 1.42mm/px · 1 of 30 slices shown (2 of 4)]
[im 1/30]
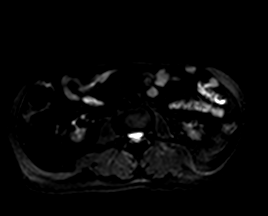

[Series 6: DWI · axial · 6.0mm · 1.42mm/px · 1 of 30 slices shown (3 of 4)]
[im 1/30]
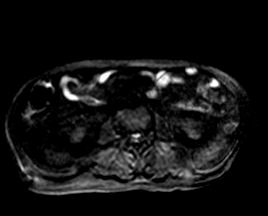

[Series 7: DWI · axial · 6.0mm · 1.42mm/px · 1 of 30 slices shown (4 of 4)]
[im 1/30]
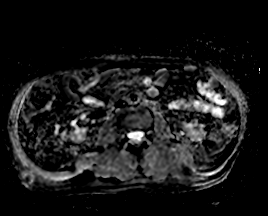

[Series 8: bSSFP · axial · 6.0mm · 0.74mm/px · 1 of 34 slices shown]
[im 1/34]
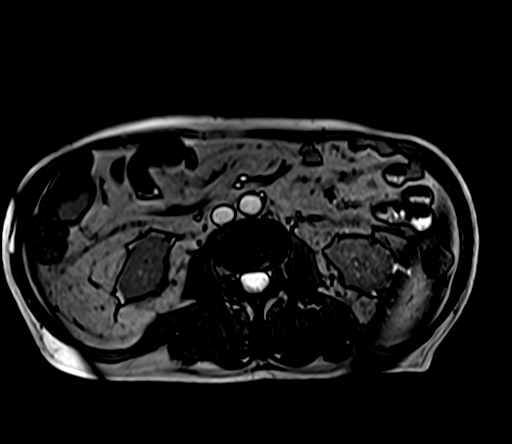

[Series 9: ax in and · axial · 3.0mm · 1.19mm/px · z∈[-127,+86]mm · 3 of 72 slices shown (1 of 2)]
[im 1/72]
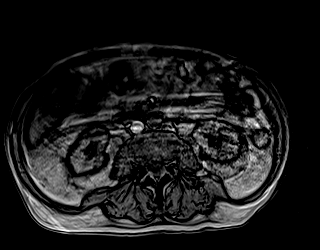
[im 36/72]
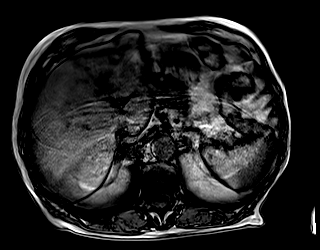
[im 72/72]
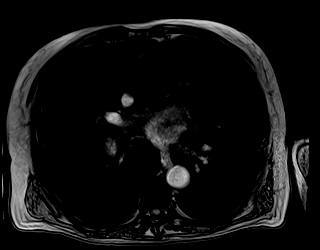

[Series 10: ax in and · axial · 3.0mm · 1.19mm/px · z∈[-127,+86]mm · 3 of 72 slices shown (2 of 2)]
[im 1/72]
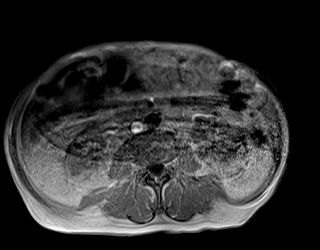
[im 36/72]
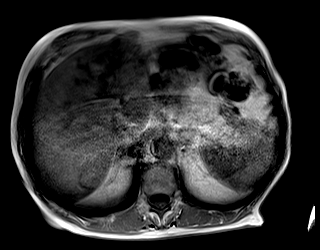
[im 72/72]
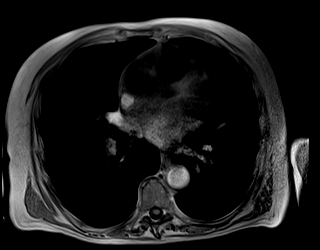

[Series 11: T1 dynamic · axial · non-contrast · 3.0mm · 1.19mm/px · z∈[-127,+86]mm · 3 of 72 slices shown (1 of 4)]
[im 1/72]
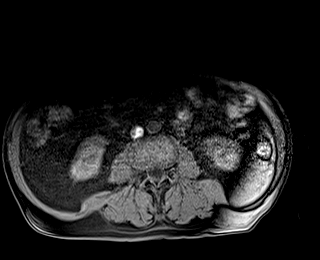
[im 36/72]
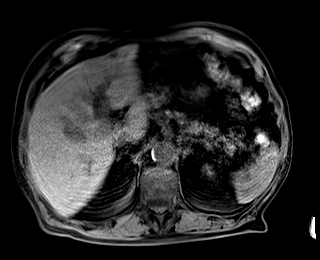
[im 72/72]
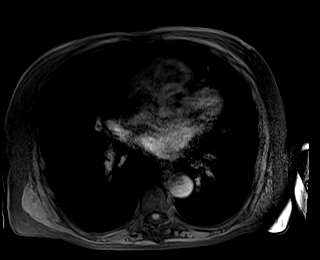

[Series 13: T1 dynamic post-contrast · axial · 3.0mm · 1.19mm/px · z∈[-127,+86]mm · 3 of 72 slices shown (1 of 6)]
[im 1/72]
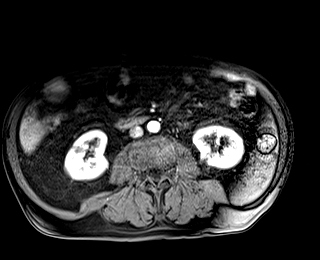
[im 36/72]
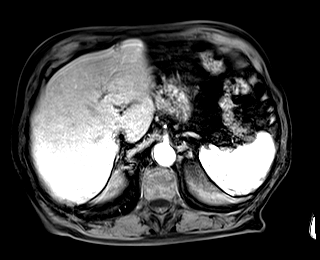
[im 72/72]
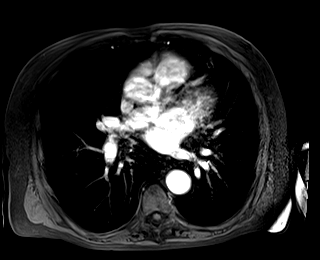

[Series 14: T1 dynamic · axial · 3.0mm · 1.19mm/px · z∈[-127,+86]mm · 3 of 72 slices shown (2 of 4)]
[im 1/72]
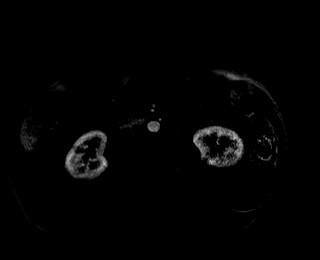
[im 36/72]
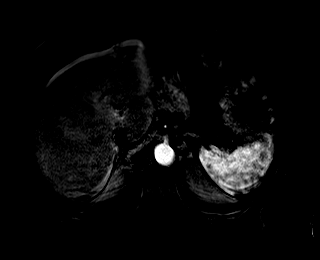
[im 72/72]
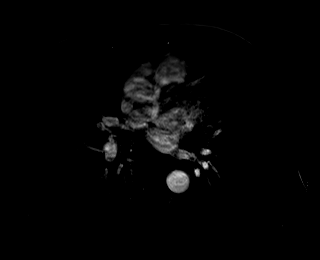

[Series 15: T1 dynamic post-contrast · axial · 3.0mm · 1.19mm/px · z∈[-127,+86]mm · 3 of 72 slices shown (2 of 6)]
[im 1/72]
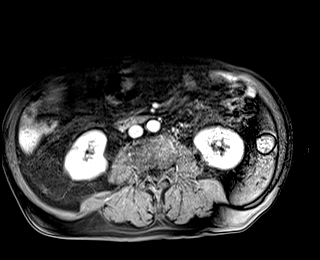
[im 36/72]
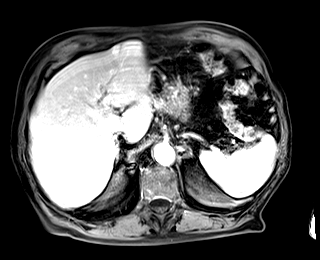
[im 72/72]
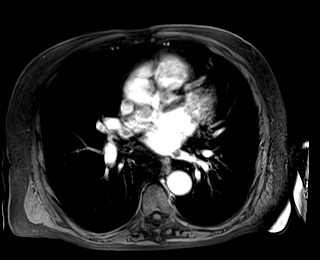

[Series 16: T1 dynamic · axial · 3.0mm · 1.19mm/px · z∈[-127,+86]mm · 3 of 72 slices shown (3 of 4)]
[im 1/72]
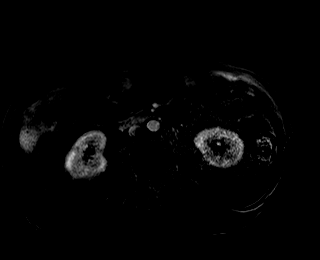
[im 36/72]
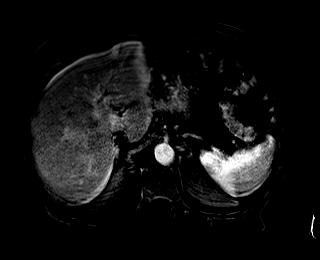
[im 72/72]
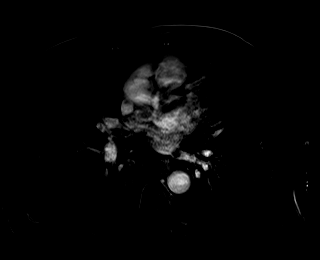

[Series 17: T1 dynamic post-contrast · axial · 3.0mm · 1.19mm/px · z∈[-127,+86]mm · 3 of 72 slices shown (3 of 6)]
[im 1/72]
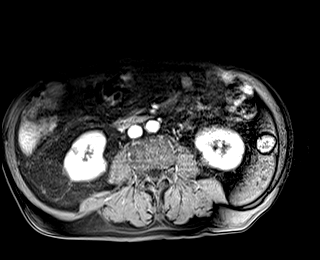
[im 36/72]
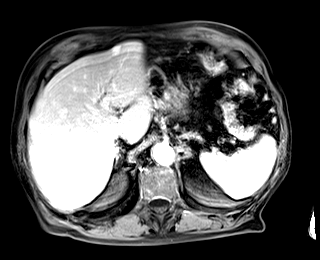
[im 72/72]
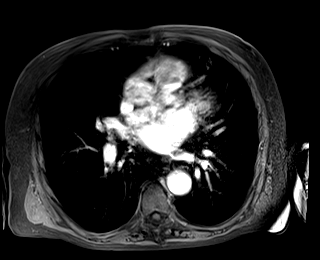

[Series 18: T1 dynamic · axial · 3.0mm · 1.19mm/px · z∈[-127,+86]mm · 3 of 72 slices shown (4 of 4)]
[im 1/72]
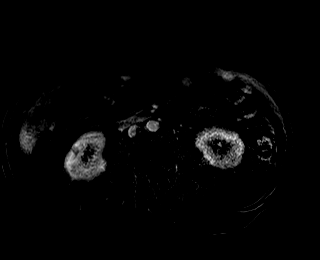
[im 36/72]
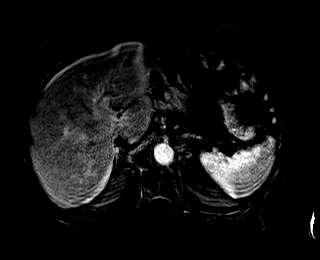
[im 72/72]
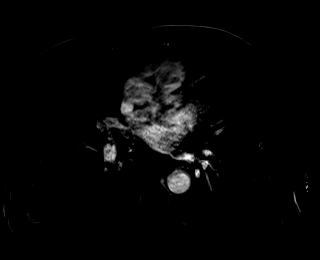

[Series 19: T1 dynamic post-contrast · coronal · 3.0mm · 1.19mm/px · 3 of 80 slices shown (4 of 6)]
[im 1/80]
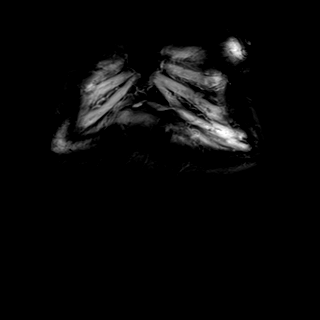
[im 40/80]
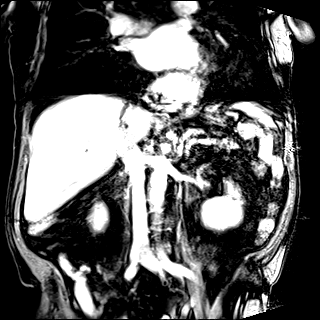
[im 80/80]
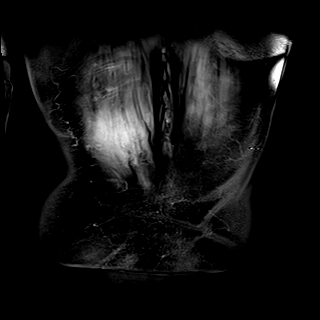

[Series 20: T1 dynamic post-contrast · axial · 3.0mm · 1.19mm/px · z∈[-127,+86]mm · 3 of 72 slices shown (5 of 6)]
[im 1/72]
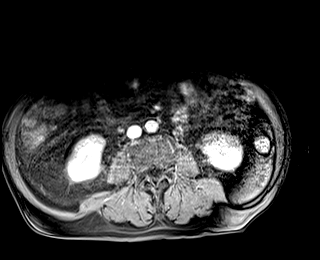
[im 36/72]
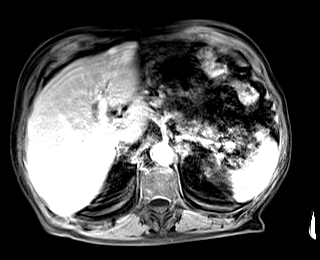
[im 72/72]
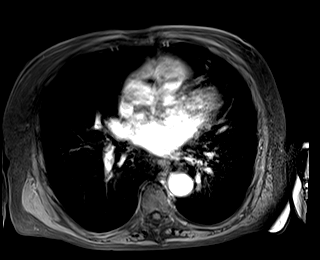

[Series 21: T1 dynamic post-contrast · axial · 3.0mm · 1.19mm/px · z∈[-127,-22]mm · 2 of 72 slices shown (6 of 6)]
[im 1/72]
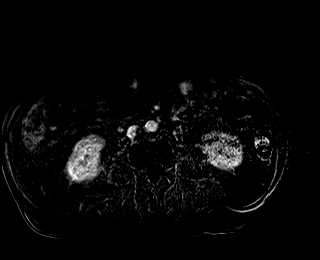
[im 36/72]
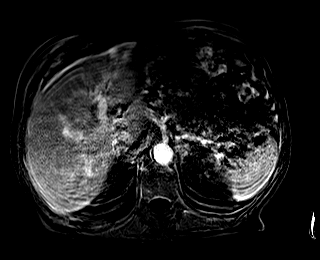

[47 of 48 positions shown; findings below may reference images not displayed]

FINDINGS: Lower chest: No acute findings.

Hepatobiliary: There is a subcapsular fluid signal lesion of the
anterior midline liver measuring 1.1 cm, demonstrating progressive
peripheral nodular contrast enhancement (series 13, image 39). There
are additional incidental subcentimeter cysts of the right lobe. No
solid mass or other parenchymal abnormality identified.

Pancreas: No mass, inflammatory changes, or other parenchymal
abnormality identified.

Spleen:  Within normal limits in size and appearance.

Adrenals/Urinary Tract: No masses identified. Simple cyst of the
superior pole of the left kidney. No evidence of hydronephrosis.

Stomach/Bowel: Visualized portions within the abdomen are
unremarkable.

Vascular/Lymphatic: No pathologically enlarged lymph nodes
identified. No abdominal aortic aneurysm demonstrated.

Other:  None.

Musculoskeletal: No suspicious bone lesions identified.
IMPRESSION: There is a 1.1 cm subcapsular fluid signal lesion of the anterior
midline liver, demonstrating progressive peripheral nodular contrast
enhancement. This is consistent with a benign hemangioma. There are
additional subcentimeter cysts of the right lobe of the liver. No
further follow-up or characterization is required.

ADDENDUM:
Addendum is made for comment on the gallbladder and common bile
duct. MRCP was not performed as part of this examination. The
gallbladder is mildly distended and contains layering sludge at the
fundus. There is an oblong filling defect in the central common bile
duct measuring approximately 2.0 cm in length, which would be very
unusual in appearance for a solitary calculus; this may reflect
multiple layering small calculi or tumefactive sludge. The common
bile duct is mildly dilated measuring 8 mm in caliber.

These results will be called to the ordering clinician or
representative by the Radiologist Assistant, and communication
documented in the PACS or [REDACTED].

*** End of Addendum ***
FINDINGS: Lower chest: No acute findings.

Hepatobiliary: There is a subcapsular fluid signal lesion of the
anterior midline liver measuring 1.1 cm, demonstrating progressive
peripheral nodular contrast enhancement (series 13, image 39). There
are additional incidental subcentimeter cysts of the right lobe. No
solid mass or other parenchymal abnormality identified.

Pancreas: No mass, inflammatory changes, or other parenchymal
abnormality identified.

Spleen:  Within normal limits in size and appearance.

Adrenals/Urinary Tract: No masses identified. Simple cyst of the
superior pole of the left kidney. No evidence of hydronephrosis.

Stomach/Bowel: Visualized portions within the abdomen are
unremarkable.

Vascular/Lymphatic: No pathologically enlarged lymph nodes
identified. No abdominal aortic aneurysm demonstrated.

Other:  None.

Musculoskeletal: No suspicious bone lesions identified.
IMPRESSION: There is a 1.1 cm subcapsular fluid signal lesion of the anterior
midline liver, demonstrating progressive peripheral nodular contrast
enhancement. This is consistent with a benign hemangioma. There are
additional subcentimeter cysts of the right lobe of the liver. No
further follow-up or characterization is required.

## 2021-04-10 DIAGNOSIS — H40033 Anatomical narrow angle, bilateral: Secondary | ICD-10-CM | POA: Diagnosis not present

## 2021-05-07 DIAGNOSIS — E782 Mixed hyperlipidemia: Secondary | ICD-10-CM | POA: Diagnosis not present

## 2021-05-07 DIAGNOSIS — E1165 Type 2 diabetes mellitus with hyperglycemia: Secondary | ICD-10-CM | POA: Diagnosis not present

## 2021-05-13 DIAGNOSIS — Z125 Encounter for screening for malignant neoplasm of prostate: Secondary | ICD-10-CM | POA: Diagnosis not present

## 2021-05-13 DIAGNOSIS — R945 Abnormal results of liver function studies: Secondary | ICD-10-CM | POA: Diagnosis not present

## 2021-05-13 DIAGNOSIS — E1165 Type 2 diabetes mellitus with hyperglycemia: Secondary | ICD-10-CM | POA: Diagnosis not present

## 2021-05-13 DIAGNOSIS — I10 Essential (primary) hypertension: Secondary | ICD-10-CM | POA: Diagnosis not present

## 2021-05-13 DIAGNOSIS — E782 Mixed hyperlipidemia: Secondary | ICD-10-CM | POA: Diagnosis not present

## 2021-05-13 DIAGNOSIS — Z5320 Procedure and treatment not carried out because of patient's decision for unspecified reasons: Secondary | ICD-10-CM | POA: Diagnosis not present

## 2021-07-17 DIAGNOSIS — H40033 Anatomical narrow angle, bilateral: Secondary | ICD-10-CM | POA: Diagnosis not present

## 2021-08-14 DIAGNOSIS — Z125 Encounter for screening for malignant neoplasm of prostate: Secondary | ICD-10-CM | POA: Diagnosis not present

## 2021-08-14 DIAGNOSIS — E1165 Type 2 diabetes mellitus with hyperglycemia: Secondary | ICD-10-CM | POA: Diagnosis not present

## 2021-08-14 DIAGNOSIS — I10 Essential (primary) hypertension: Secondary | ICD-10-CM | POA: Diagnosis not present

## 2021-08-21 DIAGNOSIS — Z5320 Procedure and treatment not carried out because of patient's decision for unspecified reasons: Secondary | ICD-10-CM | POA: Diagnosis not present

## 2021-08-21 DIAGNOSIS — E782 Mixed hyperlipidemia: Secondary | ICD-10-CM | POA: Diagnosis not present

## 2021-08-21 DIAGNOSIS — Z6825 Body mass index (BMI) 25.0-25.9, adult: Secondary | ICD-10-CM | POA: Diagnosis not present

## 2021-08-21 DIAGNOSIS — E663 Overweight: Secondary | ICD-10-CM | POA: Diagnosis not present

## 2021-08-21 DIAGNOSIS — R945 Abnormal results of liver function studies: Secondary | ICD-10-CM | POA: Diagnosis not present

## 2021-08-21 DIAGNOSIS — E1165 Type 2 diabetes mellitus with hyperglycemia: Secondary | ICD-10-CM | POA: Diagnosis not present

## 2021-08-21 DIAGNOSIS — I10 Essential (primary) hypertension: Secondary | ICD-10-CM | POA: Diagnosis not present

## 2021-10-03 ENCOUNTER — Ambulatory Visit
Admission: EM | Admit: 2021-10-03 | Discharge: 2021-10-03 | Disposition: A | Discharge status: Home / Self Care | Payer: Medicare Other | Attending: Nurse Practitioner | Admitting: Nurse Practitioner

## 2021-10-03 DIAGNOSIS — L089 Local infection of the skin and subcutaneous tissue, unspecified: Secondary | ICD-10-CM

## 2021-10-03 MED ORDER — SULFAMETHOXAZOLE-TRIMETHOPRIM 800-160 MG PO TABS: 1.0000 | ORAL_TABLET | Freq: Two times a day (BID) | ORAL | 0 refills | Status: AC

## 2021-10-03 NOTE — ED Triage Notes (Signed)
Pt reports right foot pain and redness x 2 days.

## 2021-10-03 NOTE — Discharge Instructions (Addendum)
Take medication as prescribed. May take over-the-counter Tylenol for pain or discomfort. Continue warm Epsom salt soaks 2 times daily while symptoms persist. Continue to monitor the area for increasing redness, drainage, or swelling. If you develop fever, chills, worsening redness, swelling, drainage, or if you develop fever, chills, chest pain, abdominal pain, please go to the emergency department immediately.

## 2021-10-03 NOTE — ED Provider Notes (Signed)
[facility]    CSN: [id number] Arrival date & time: 10/03/21  0835      History   Chief Complaint Chief Complaint  Patient presents with   Foot Pain    HPI Sean Mcfarland is a 81 y.o. male.   The history is provided by the patient.   Patient presents for irritation and pain to the right foot.  Patient states symptoms started approximately 2 days ago.  States he noticed symptoms after he got off of his tractor.  Patient has redness and irritation to the top of the left foot under the third and fourth toes.  Patient states area is painful with walking.  He denies fever, chills, oozing, swelling, numbness, tingling, drainage, chest pain, abdominal pain, nausea, vomiting, or diarrhea.  Patient states that he soak the right foot in Epsom salt for his symptoms. 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 without obstruction    Anemia 12/15/2019   Choledocholithiasis 12/15/2019   Abnormal LFTs 11/29/2019   Closed fracture of fifth metatarsal bone of right foot with routine healing, subsequent encounter 02/17/19 04/04/2019    Past Surgical History:  Procedure Laterality Date   CHOLECYSTECTOMY N/A 01/13/2020   Procedure: LAPAROSCOPIC CHOLECYSTECTOMY;  Surgeon: Aviva Signs, MD;  Location: [facility];  Service: General;  Laterality: N/A;   ERCP N/A 12/02/2019   Procedure: ENDOSCOPIC RETROGRADE CHOLANGIOPANCREATOGRAPHY (ERCP);  Surgeon: Rogene Houston, MD; Dilated CBD and CHD s/p biliary sphincterotomy and large stone removal with balloon stone extractor.   REMOVAL OF STONES N/A 12/02/2019   Procedure: REMOVAL OF STONES;  Surgeon: Rogene Houston, MD;  [facility];  Service: Endoscopy;  Laterality: N/A;   SPHINCTEROTOMY N/A  12/02/2019   Procedure: SPHINCTEROTOMY;  Surgeon: Rogene Houston, MD;  [facility]: [facility];  Service: Endoscopy;  Laterality: N/A;       Home Medications    Prior to Admission medications   Medication Sig Start Date End Date Taking? Authorizing Provider  sulfamethoxazole-trimethoprim (BACTRIM DS) 800-160 MG tablet Take 1 tablet by mouth 2 (two) times daily for 7 days. 10/03/21 10/10/21 Yes Diahann Guajardo-Warren, Alda Lea, NP  Ascorbic Acid (VITAMIN C) 500 MG CAPS Take 500 mg by mouth daily.    [provider]  benazepril (LOTENSIN) 20 MG tablet Take 1 tablet (20 mg total) by mouth daily. 12/02/19   Rehman, Mechele Dawley, MD  cetirizine (ZYRTEC) 5 MG tablet Take 1 tablet (5 mg total) by mouth daily. 12/17/20   Jaynee Eagles, PA-C  Cinnamon 500 MG capsule Take 500 mg by mouth daily.    [provider]  dorzolamide-timolol (COSOPT) 22.3-6.8 MG/ML ophthalmic solution Place 1 drop into both eyes in the morning and at bedtime. 11/25/19   [provider]  Nyoka Cowden Tea 315 MG CAPS Take 315 mg by mouth daily.    [provider]  Multiple Vitamin (MULTIVITAMIN WITH MINERALS) TABS tablet Take 1 tablet by mouth daily.    [provider]  Omega-3 Fatty Acids (FISH OIL) 1000 MG CAPS Take 1,000 mg by mouth daily.    [provider]  predniSONE (DELTASONE) 10 MG tablet Take 3 tablets (30 mg total) by mouth daily with breakfast. 12/17/20   Jaynee Eagles, PA-C  promethazine-dextromethorphan (PROMETHAZINE-DM) 6.25-15 MG/5ML syrup Take 5 mLs by mouth at bedtime as needed for cough. 12/17/20   Jaynee Eagles, PA-C  pseudoephedrine (SUDAFED) 30 MG tablet Take 1 tablet (30 mg total) by mouth every 8 (eight) hours as needed for congestion. 12/17/20   Jaynee Eagles, PA-C  traMADol (ULTRAM) 50 MG tablet Take 1 tablet (50 mg total) by mouth every 6 (six) hours as needed. 01/13/20   Aviva Signs, MD  vitamin E 180 MG (400 UNITS) capsule Take 400 Units by mouth daily.    [provider]    Family History Family History  Problem Relation Age of Onset   Healthy Mother    Healthy Father 52       ?stroke   Breast cancer Daughter        stage 71   Colon cancer Son        age 35s   Liver disease Neg Hx     Social History Social History   Tobacco Use   Smoking status: Never   Smokeless tobacco: Never  Vaping Use   Vaping Use: Never used  Substance Use Topics   Alcohol use: Never   Drug use: Never     Allergies   Patient has no known allergies.   Review of Systems Review of Systems Per HPI  Physical Exam Triage Vital Signs ED Triage Vitals  Enc Vitals Group     BP 10/03/21 0918 (!) 183/85     Pulse Rate 10/03/21 0918 67     Resp 10/03/21 0918 18     Temp 10/03/21 0918 (!) 97.5 F (36.4 C)     Temp Source 10/03/21 0918 Oral     SpO2 10/03/21 0918 98 %     Weight --      Height --      Head Circumference --      Peak Flow --      Pain Score 10/03/21 0917 6     Pain Loc --      Pain Edu? --      Excl. in [place]? --    No data found.  Updated Vital Signs BP (!) 183/85 (BP Location: Right Arm)   Pulse 67   Temp (!) 97.5 F (36.4 C) (Oral)   Resp 18   SpO2 98%   Visual Acuity Right Eye Distance:   Left Eye Distance:   Bilateral Distance:    Right Eye Near:   Left Eye Near:    Bilateral Near:     Physical Exam Vitals and nursing note reviewed.  Constitutional:      General: He is not in acute distress.    Appearance: Normal appearance. He is well-developed.  HENT:     Head: Normocephalic and atraumatic.  Eyes:     Extraocular Movements: Extraocular movements intact.     Conjunctiva/sclera: Conjunctivae normal.     Pupils: Pupils are equal, round, and reactive to light.  Cardiovascular:     Rate and Rhythm: Normal rate and regular rhythm.     Heart sounds: No murmur heard. Pulmonary:     Effort: Pulmonary effort is normal. No respiratory distress.     Breath sounds: Normal breath sounds.  Abdominal:      General: Bowel sounds are normal.     Palpations: Abdomen is soft.     Tenderness: There is no abdominal tenderness.  Musculoskeletal:        General: No swelling.     Cervical back: Normal range  of motion.  Lymphadenopathy:     Cervical: No cervical adenopathy.  Skin:    Capillary Refill: Capillary refill takes less than 2 seconds.     Comments: Erythematous area to the right foot of the third and fourth metatarsals.  There is no oozing, fluctuance, or drainage present.  Area is tender to palpation.  +2 DP/PT pulses.  Neurological:     Mental Status: He is alert.  Psychiatric:        Mood and Affect: Mood normal.      UC Treatments / Results  Labs (all labs ordered are listed, but only abnormal results are displayed) Labs Reviewed - No data to display  EKG   Radiology No results found.  Procedures Procedures (including critical care time)  Medications Ordered in UC Medications - No data to display  Initial Impression / Assessment and Plan / UC Course  I have reviewed the triage vital signs and the nursing notes.  Pertinent labs & imaging results that were available during my care of the patient were reviewed by me and considered in my medical decision making (see chart for details).  Patient presents with complaints of redness and irritation of the right foot.  On exam, patient has an area of erythema with tenderness to the third and fourth metatarsals.  There is no oozing, fluctuance, or drainage present.  We will treat patient prophylactically with Bactrim DS to prevent worsening symptoms given his age.  Supportive care recommendations were also provided to the patient to include continuation of Epsom salt soaks and over-the-counter analgesics such as Tylenol.  The patient was given strict indications of when to follow-up in this clinic, with his primary care physician, and when to go to the emergency department. Final Clinical Impressions(s) / UC Diagnoses   Final  diagnoses:  Skin infection     Discharge Instructions      Take medication as prescribed. May take over-the-counter Tylenol for pain or discomfort. Continue warm Epsom salt soaks 2 times daily while symptoms persist. Continue to monitor the area for increasing redness, drainage, or swelling. If you develop fever, chills, worsening redness, swelling, drainage, or if you develop fever, chills, chest pain, abdominal pain, please go to the emergency department immediately.     ED Prescriptions     Medication Sig Dispense Auth. Provider   sulfamethoxazole-trimethoprim (BACTRIM DS) 800-160 MG tablet Take 1 tablet by mouth 2 (two) times daily for 7 days. 14 tablet Shatima Zalar-Warren, Alda Lea, NP      PDMP not reviewed this encounter.   Tish Men, NP 10/03/21 1001

## 2021-10-28 DIAGNOSIS — H40033 Anatomical narrow angle, bilateral: Secondary | ICD-10-CM | POA: Diagnosis not present

## 2021-11-27 DIAGNOSIS — E1165 Type 2 diabetes mellitus with hyperglycemia: Secondary | ICD-10-CM | POA: Diagnosis not present

## 2021-11-27 DIAGNOSIS — I10 Essential (primary) hypertension: Secondary | ICD-10-CM | POA: Diagnosis not present

## 2021-11-29 DIAGNOSIS — E782 Mixed hyperlipidemia: Secondary | ICD-10-CM | POA: Diagnosis not present

## 2021-11-29 DIAGNOSIS — Z5320 Procedure and treatment not carried out because of patient's decision for unspecified reasons: Secondary | ICD-10-CM | POA: Diagnosis not present

## 2021-11-29 DIAGNOSIS — I10 Essential (primary) hypertension: Secondary | ICD-10-CM | POA: Diagnosis not present

## 2021-11-29 DIAGNOSIS — R945 Abnormal results of liver function studies: Secondary | ICD-10-CM | POA: Diagnosis not present

## 2021-11-29 DIAGNOSIS — E1165 Type 2 diabetes mellitus with hyperglycemia: Secondary | ICD-10-CM | POA: Diagnosis not present

## 2022-05-05 DIAGNOSIS — H40033 Anatomical narrow angle, bilateral: Secondary | ICD-10-CM | POA: Diagnosis not present

## 2022-05-27 DIAGNOSIS — E1165 Type 2 diabetes mellitus with hyperglycemia: Secondary | ICD-10-CM | POA: Diagnosis not present

## 2022-05-27 DIAGNOSIS — I10 Essential (primary) hypertension: Secondary | ICD-10-CM | POA: Diagnosis not present

## 2022-06-02 DIAGNOSIS — R945 Abnormal results of liver function studies: Secondary | ICD-10-CM | POA: Diagnosis not present

## 2022-06-02 DIAGNOSIS — I10 Essential (primary) hypertension: Secondary | ICD-10-CM | POA: Diagnosis not present

## 2022-06-02 DIAGNOSIS — Z713 Dietary counseling and surveillance: Secondary | ICD-10-CM | POA: Diagnosis not present

## 2022-06-02 DIAGNOSIS — E782 Mixed hyperlipidemia: Secondary | ICD-10-CM | POA: Diagnosis not present

## 2022-06-02 DIAGNOSIS — Z79899 Other long term (current) drug therapy: Secondary | ICD-10-CM | POA: Diagnosis not present

## 2022-06-02 DIAGNOSIS — E1165 Type 2 diabetes mellitus with hyperglycemia: Secondary | ICD-10-CM | POA: Diagnosis not present

## 2022-06-02 DIAGNOSIS — Z5320 Procedure and treatment not carried out because of patient's decision for unspecified reasons: Secondary | ICD-10-CM | POA: Diagnosis not present

## 2022-06-02 DIAGNOSIS — Z6826 Body mass index (BMI) 26.0-26.9, adult: Secondary | ICD-10-CM | POA: Diagnosis not present

## 2022-11-26 DIAGNOSIS — E1165 Type 2 diabetes mellitus with hyperglycemia: Secondary | ICD-10-CM | POA: Diagnosis not present

## 2022-11-26 DIAGNOSIS — I10 Essential (primary) hypertension: Secondary | ICD-10-CM | POA: Diagnosis not present

## 2022-12-02 DIAGNOSIS — R945 Abnormal results of liver function studies: Secondary | ICD-10-CM | POA: Diagnosis not present

## 2022-12-02 DIAGNOSIS — Z5320 Procedure and treatment not carried out because of patient's decision for unspecified reasons: Secondary | ICD-10-CM | POA: Diagnosis not present

## 2022-12-02 DIAGNOSIS — E782 Mixed hyperlipidemia: Secondary | ICD-10-CM | POA: Diagnosis not present

## 2022-12-02 DIAGNOSIS — I10 Essential (primary) hypertension: Secondary | ICD-10-CM | POA: Diagnosis not present

## 2022-12-02 DIAGNOSIS — E1165 Type 2 diabetes mellitus with hyperglycemia: Secondary | ICD-10-CM | POA: Diagnosis not present

## 2023-04-28 DIAGNOSIS — I10 Essential (primary) hypertension: Secondary | ICD-10-CM | POA: Diagnosis not present

## 2023-04-28 DIAGNOSIS — E119 Type 2 diabetes mellitus without complications: Secondary | ICD-10-CM | POA: Diagnosis not present

## 2023-05-26 DIAGNOSIS — I10 Essential (primary) hypertension: Secondary | ICD-10-CM | POA: Diagnosis not present

## 2023-05-26 DIAGNOSIS — E1165 Type 2 diabetes mellitus with hyperglycemia: Secondary | ICD-10-CM | POA: Diagnosis not present

## 2023-06-02 DIAGNOSIS — E782 Mixed hyperlipidemia: Secondary | ICD-10-CM | POA: Diagnosis not present

## 2023-06-02 DIAGNOSIS — I10 Essential (primary) hypertension: Secondary | ICD-10-CM | POA: Diagnosis not present

## 2023-06-02 DIAGNOSIS — Z79899 Other long term (current) drug therapy: Secondary | ICD-10-CM | POA: Diagnosis not present

## 2023-06-02 DIAGNOSIS — E1165 Type 2 diabetes mellitus with hyperglycemia: Secondary | ICD-10-CM | POA: Diagnosis not present

## 2023-06-02 DIAGNOSIS — Z713 Dietary counseling and surveillance: Secondary | ICD-10-CM | POA: Diagnosis not present

## 2023-06-02 DIAGNOSIS — Z7984 Long term (current) use of oral hypoglycemic drugs: Secondary | ICD-10-CM | POA: Diagnosis not present

## 2023-06-02 DIAGNOSIS — R7401 Elevation of levels of liver transaminase levels: Secondary | ICD-10-CM | POA: Diagnosis not present

## 2023-06-02 DIAGNOSIS — R945 Abnormal results of liver function studies: Secondary | ICD-10-CM | POA: Diagnosis not present

## 2023-06-02 DIAGNOSIS — Z6825 Body mass index (BMI) 25.0-25.9, adult: Secondary | ICD-10-CM | POA: Diagnosis not present

## 2023-11-26 DIAGNOSIS — I10 Essential (primary) hypertension: Secondary | ICD-10-CM | POA: Diagnosis not present

## 2023-11-26 DIAGNOSIS — E1165 Type 2 diabetes mellitus with hyperglycemia: Secondary | ICD-10-CM | POA: Diagnosis not present

## 2023-12-01 DIAGNOSIS — E782 Mixed hyperlipidemia: Secondary | ICD-10-CM | POA: Diagnosis not present

## 2023-12-01 DIAGNOSIS — Z7984 Long term (current) use of oral hypoglycemic drugs: Secondary | ICD-10-CM | POA: Diagnosis not present

## 2023-12-01 DIAGNOSIS — E1165 Type 2 diabetes mellitus with hyperglycemia: Secondary | ICD-10-CM | POA: Diagnosis not present

## 2023-12-01 DIAGNOSIS — Z2821 Immunization not carried out because of patient refusal: Secondary | ICD-10-CM | POA: Diagnosis not present

## 2023-12-01 DIAGNOSIS — Z5320 Procedure and treatment not carried out because of patient's decision for unspecified reasons: Secondary | ICD-10-CM | POA: Diagnosis not present

## 2023-12-01 DIAGNOSIS — Z0000 Encounter for general adult medical examination without abnormal findings: Secondary | ICD-10-CM | POA: Diagnosis not present

## 2023-12-01 DIAGNOSIS — Z0001 Encounter for general adult medical examination with abnormal findings: Secondary | ICD-10-CM | POA: Diagnosis not present

## 2023-12-01 DIAGNOSIS — Z79899 Other long term (current) drug therapy: Secondary | ICD-10-CM | POA: Diagnosis not present

## 2023-12-01 DIAGNOSIS — I10 Essential (primary) hypertension: Secondary | ICD-10-CM | POA: Diagnosis not present

## 2023-12-01 DIAGNOSIS — R945 Abnormal results of liver function studies: Secondary | ICD-10-CM | POA: Diagnosis not present

## 2023-12-03 ENCOUNTER — Ambulatory Visit
Admission: EM | Admit: 2023-12-03 | Discharge: 2023-12-03 | Disposition: A | Discharge status: Home / Self Care | Attending: Nurse Practitioner | Admitting: Nurse Practitioner

## 2023-12-03 DIAGNOSIS — M545 Low back pain, unspecified: Secondary | ICD-10-CM | POA: Diagnosis not present

## 2023-12-03 MED ORDER — TIZANIDINE HCL 2 MG PO TABS: 2.0000 mg | ORAL_TABLET | Freq: Three times a day (TID) | ORAL | 0 refills | Status: AC | PRN

## 2023-12-03 NOTE — ED Triage Notes (Addendum)
 Pt reports lower left side back pain during  fall was eating sitting on a stool and fell off the stool hitting the left side pain is present. Denies head injury during the fall. Fall occurred x 2 days ago.

## 2023-12-03 NOTE — ED Provider Notes (Signed)
 [facility]    CSN: [id number] Arrival date & time: 12/03/23  1340      History   Chief Complaint No chief complaint on file.   HPI Sean Mcfarland is a 83 y.o. male.   Patient presents today with left lower back pain that began a couple of days ago.  Reports he was sitting on a stool eating dinner when he scooted back in the stool and the stool started to fall.  Reports he fell with the stool striking his left low back.  He did not hit his head or lose consciousness.  No swelling or bruising that he knows of.  No new urinary or bowel incontinence, radiating pain down his legs, weakness of lower extremities, or decreased sensation of lower extremities.  No new urinary symptoms.  Has taken Tylenol and has tried topical horse liniment for symptoms without improvement.    Past Medical History:  Diagnosis Date   Complication of anesthesia    urinary retention after ERCP   Diabetes mellitus without complication (HCC)    diet controlled   Hypertension     Patient Active Problem List   Diagnosis Date Noted   Calculus of gallbladder without cholecystitis without obstruction    Anemia 12/15/2019   Choledocholithiasis 12/15/2019   Abnormal LFTs 11/29/2019   Closed fracture of fifth metatarsal bone of right foot with routine healing, subsequent encounter 02/17/19 04/04/2019    Past Surgical History:  Procedure Laterality Date   CHOLECYSTECTOMY N/A 01/13/2020   Procedure: LAPAROSCOPIC CHOLECYSTECTOMY;  Surgeon: Mavis Anes, MD;  Location: [facility];  Service: General;  Laterality: N/A;   ERCP N/A 12/02/2019   Procedure: ENDOSCOPIC RETROGRADE CHOLANGIOPANCREATOGRAPHY (ERCP);  Surgeon: Golda Claudis PENNER, MD; Dilated CBD and CHD s/p biliary sphincterotomy and large stone removal with balloon stone extractor.   REMOVAL OF STONES N/A 12/02/2019   Procedure: REMOVAL OF STONES;  Surgeon: Golda Claudis PENNER, MD;  Location: [facility];  Service: Endoscopy;  Laterality: N/A;    SPHINCTEROTOMY N/A 12/02/2019   Procedure: SPHINCTEROTOMY;  Surgeon: Golda Claudis PENNER, MD;  Location: [facility];  Service: Endoscopy;  Laterality: N/A;       Home Medications    Prior to Admission medications   Medication Sig Start Date End Date Taking? Authorizing Provider  tiZANidine (ZANAFLEX) 2 MG tablet Take 1 tablet (2 mg total) by mouth every 8 (eight) hours as needed for muscle spasms. Do not take with alcohol or while driving or operating heavy machinery.  May cause drowsiness. 12/03/23  Yes Chandra Harlene LABOR, NP  Ascorbic Acid (VITAMIN C) 500 MG CAPS Take 500 mg by mouth daily.    [provider]  benazepril (LOTENSIN) 20 MG tablet Take 1 tablet (20 mg total) by mouth daily. 12/02/19   Rehman, Claudis PENNER, MD  cetirizine  (ZYRTEC ) 5 MG tablet Take 1 tablet (5 mg total) by mouth daily. 12/17/20   Christopher Savannah, PA-C  Cinnamon 500 MG capsule Take 500 mg by mouth daily.    [provider]  dorzolamide-timolol (COSOPT) 22.3-6.8 MG/ML ophthalmic solution Place 1 drop into both eyes in the morning and at bedtime. 11/25/19   [provider]  Landy Tea 315 MG CAPS Take 315 mg by mouth daily.    [provider]  Multiple Vitamin (MULTIVITAMIN WITH MINERALS) TABS tablet Take 1 tablet by mouth daily.    [provider]  Omega-3 Fatty Acids (FISH OIL) 1000 MG CAPS Take 1,000 mg by mouth daily.  [provider]  predniSONE  (DELTASONE ) 10 MG tablet Take 3 tablets (30 mg total) by mouth daily with breakfast. 12/17/20   Christopher Savannah, PA-C  promethazine -dextromethorphan (PROMETHAZINE -DM) 6.25-15 MG/5ML syrup Take 5 mLs by mouth at bedtime as needed for cough. 12/17/20   Christopher Savannah, PA-C  pseudoephedrine  (SUDAFED) 30 MG tablet Take 1 tablet (30 mg total) by mouth every 8 (eight) hours as needed for congestion. 12/17/20   Christopher Savannah, PA-C  traMADol  (ULTRAM ) 50 MG tablet Take 1 tablet (50 mg total) by mouth every 6 (six) hours as needed. 01/13/20    Mavis Anes, MD  vitamin E 180 MG (400 UNITS) capsule Take 400 Units by mouth daily.    [provider]    Family History Family History  Problem Relation Age of Onset   Healthy Mother    Healthy Father 13       ?stroke   Breast cancer Daughter        stage 4   Colon cancer Son        age 48s   Liver disease Neg Hx     Social History Social History   Tobacco Use   Smoking status: Never   Smokeless tobacco: Never  Vaping Use   Vaping status: Never Used  Substance Use Topics   Alcohol use: Never   Drug use: Never     Allergies   Patient has no known allergies.   Review of Systems Review of Systems Per HPI  Physical Exam Triage Vital Signs ED Triage Vitals  Encounter Vitals Group     BP 12/03/23 1418 (!) 167/81     Girls Systolic BP Percentile --      Girls Diastolic BP Percentile --      Boys Systolic BP Percentile --      Boys Diastolic BP Percentile --      Pulse Rate 12/03/23 1418 70     Resp 12/03/23 1418 16     Temp 12/03/23 1418 98.6 F (37 C)     Temp Source 12/03/23 1418 Oral     SpO2 12/03/23 1418 95 %     Weight --      Height --      Head Circumference --      Peak Flow --      Pain Score 12/03/23 1419 6     Pain Loc --      Pain Education --      Exclude from Growth Chart --    No data found.  Updated Vital Signs BP (!) 167/81 (BP Location: Right Arm)   Pulse 70   Temp 98.6 F (37 C) (Oral)   Resp 16   SpO2 95%   Visual Acuity Right Eye Distance:   Left Eye Distance:   Bilateral Distance:    Right Eye Near:   Left Eye Near:    Bilateral Near:     Physical Exam Vitals and nursing note reviewed.  Constitutional:      General: He is not in acute distress.    Appearance: Normal appearance. He is not toxic-appearing.  Pulmonary:     Effort: Pulmonary effort is normal. No respiratory distress.  Musculoskeletal:       Arms:     Right lower leg: No edema.     Left lower leg: No edema.     Comments: Pain to area  marked; no bruising, redness, swelling, obvious deformity.  Patient has full range of motion of bilateral lower extremities and  is neurovascular intact in distal bilateral lower extremities.  Skin:    General: Skin is warm and dry.     Capillary Refill: Capillary refill takes less than 2 seconds.     Coloration: Skin is not jaundiced or pale.     Findings: No erythema.  Neurological:     Mental Status: He is alert and oriented to person, place, and time.  Psychiatric:        Behavior: Behavior is cooperative.      UC Treatments / Results  Labs (all labs ordered are listed, but only abnormal results are displayed) Labs Reviewed - No data to display  EKG   Radiology No results found.  Procedures Procedures (including critical care time)  Medications Ordered in UC Medications - No data to display  Initial Impression / Assessment and Plan / UC Course  I have reviewed the triage vital signs and the nursing notes.  Pertinent labs & imaging results that were available during my care of the patient were reviewed by me and considered in my medical decision making (see chart for details).   Patient is mildly hypertensive in triage today, otherwise vital signs are stable.  1. Acute left-sided low back pain without sciatica Suspect contusion due to mechanism of injury X-ray imaging deferred using shared patient decision making Supportive care discussed-continue Tylenol, start tizanidine Return and ER precautions discussed  The patient was given the opportunity to ask questions.  All questions answered to their satisfaction.  The patient is in agreement to this plan.   Final Clinical Impressions(s) / UC Diagnoses   Final diagnoses:  Acute left-sided low back pain without sciatica     Discharge Instructions      The low back pain is consistent with a contusion.  In addition to Tylenol, you can take the tizanidine (muscle relaxant) as needed for muscle pain.  Also recommend  warm compresses and light range of motion/stretching exercises.  Seek care if symptoms do not improve with treatment.    ED Prescriptions     Medication Sig Dispense Auth. Provider   tiZANidine (ZANAFLEX) 2 MG tablet Take 1 tablet (2 mg total) by mouth every 8 (eight) hours as needed for muscle spasms. Do not take with alcohol or while driving or operating heavy machinery.  May cause drowsiness. 30 tablet Chandra Harlene LABOR, NP      PDMP not reviewed this encounter.   Chandra Harlene LABOR, NP 12/03/23 1500

## 2023-12-03 NOTE — Discharge Instructions (Signed)
 The low back pain is consistent with a contusion.  In addition to Tylenol, you can take the tizanidine (muscle relaxant) as needed for muscle pain.  Also recommend warm compresses and light range of motion/stretching exercises.  Seek care if symptoms do not improve with treatment.

## 2023-12-08 ENCOUNTER — Other Ambulatory Visit (INDEPENDENT_AMBULATORY_CARE_PROVIDER_SITE_OTHER): Payer: Self-pay

## 2023-12-08 ENCOUNTER — Ambulatory Visit: Admitting: Orthopedic Surgery

## 2023-12-08 ENCOUNTER — Encounter: Payer: Self-pay | Admitting: Orthopedic Surgery

## 2023-12-08 VITALS — BP 151/74 | HR 65 | Ht 67.0 in | Wt 157.0 lb

## 2023-12-08 DIAGNOSIS — M545 Low back pain, unspecified: Secondary | ICD-10-CM

## 2023-12-09 NOTE — Progress Notes (Signed)
 New Patient Visit  Assessment: Sean Mcfarland is a 83 y.o. male with the following: 1. Lumbar pain  Plan: REILEY KEISLER has some pain in the left side of his lower back.  He recently fell, and impacted this area on a wooden stool.  He has been slow to recover.  Radiographs have been obtained, which are negative.  There is some bruising in the upper portion of his back, which is almost completely healed.  He has no radiating pains distally.  I provided reassurance.  Medicines as needed.  Muscle relaxers are not providing relief, he does not need to keep taking these.  I do not appreciate compression fractures on the radiographs which are available.  Follow-up: Return if symptoms worsen or fail to improve.  Subjective:  Chief Complaint  Patient presents with   Back Pain    L LBP for 1 wk after fall. Pain isn't better after muscle relaxers.     History of Present Illness: Sean Mcfarland is a 83 y.o. male who presents for evaluation of left-sided low back pain.  Approximate 1 week ago, he lost his balance and landed on a wooden stool.  He impacted the left side of his lower back.  Since then, he has had more pain.  He has not had difficulty ambulating.  She is not complaining of pain radiating into either leg.  He was evaluated at an urgent care center, and was provided with some muscle relaxers.  The muscle relaxers are not improving his symptoms.  Family is concerned because he has not recovered since the injury a week ago.   Review of Systems: No fevers or chills No numbness or tingling No chest pain No shortness of breath No bowel or bladder dysfunction No GI distress No headaches   Medical History:  Past Medical History:  Diagnosis Date   Complication of anesthesia    urinary retention after ERCP   Diabetes mellitus without complication (HCC)    diet controlled   Hypertension     Past Surgical History:  Procedure Laterality Date   CHOLECYSTECTOMY N/A 01/13/2020    Procedure: LAPAROSCOPIC CHOLECYSTECTOMY;  Surgeon: Mavis Anes, MD;  Location: [facility];  Service: General;  Laterality: N/A;   ERCP N/A 12/02/2019   Procedure: ENDOSCOPIC RETROGRADE CHOLANGIOPANCREATOGRAPHY (ERCP);  Surgeon: Golda Claudis PENNER, MD; Dilated CBD and CHD s/p biliary sphincterotomy and large stone removal with balloon stone extractor.   REMOVAL OF STONES N/A 12/02/2019   Procedure: REMOVAL OF STONES;  Surgeon: Golda Claudis PENNER, MD;  Location: [facility];  Service: Endoscopy;  Laterality: N/A;   SPHINCTEROTOMY N/A 12/02/2019   Procedure: SPHINCTEROTOMY;  Surgeon: Golda Claudis PENNER, MD;  Location: [facility];  Service: Endoscopy;  Laterality: N/A;    Family History  Problem Relation Age of Onset   Healthy Mother    Healthy Father 66       ?stroke   Breast cancer Daughter        stage 4   Colon cancer Son        age 25s   Liver disease Neg Hx    Social History   Tobacco Use   Smoking status: Never   Smokeless tobacco: Never  Vaping Use   Vaping status: Never Used  Substance Use Topics   Alcohol use: Never   Drug use: Never    No Known Allergies  Current Meds  Medication Sig   Ascorbic Acid (VITAMIN C) 500 MG CAPS Take 500 mg by  mouth daily.   benazepril (LOTENSIN) 20 MG tablet Take 1 tablet (20 mg total) by mouth daily.   cetirizine  (ZYRTEC ) 5 MG tablet Take 1 tablet (5 mg total) by mouth daily.   Cinnamon 500 MG capsule Take 500 mg by mouth daily.   dorzolamide-timolol (COSOPT) 22.3-6.8 MG/ML ophthalmic solution Place 1 drop into both eyes in the morning and at bedtime.   Green Tea 315 MG CAPS Take 315 mg by mouth daily.   Multiple Vitamin (MULTIVITAMIN WITH MINERALS) TABS tablet Take 1 tablet by mouth daily.   Omega-3 Fatty Acids (FISH OIL) 1000 MG CAPS Take 1,000 mg by mouth daily.   predniSONE  (DELTASONE ) 10 MG tablet Take 3 tablets (30 mg total) by mouth daily with breakfast.   promethazine -dextromethorphan (PROMETHAZINE -DM) 6.25-15 MG/5ML syrup Take 5  mLs by mouth at bedtime as needed for cough.   pseudoephedrine  (SUDAFED) 30 MG tablet Take 1 tablet (30 mg total) by mouth every 8 (eight) hours as needed for congestion.   tiZANidine (ZANAFLEX) 2 MG tablet Take 1 tablet (2 mg total) by mouth every 8 (eight) hours as needed for muscle spasms. Do not take with alcohol or while driving or operating heavy machinery.  May cause drowsiness.   traMADol  (ULTRAM ) 50 MG tablet Take 1 tablet (50 mg total) by mouth every 6 (six) hours as needed.   vitamin E 180 MG (400 UNITS) capsule Take 400 Units by mouth daily.    Objective: BP (!) 151/74   Pulse 65   Ht 5' 7 (1.702 m)   Wt 157 lb (71.2 kg)   BMI 24.59 kg/m   Physical Exam:  General: Elderly male., Alert and oriented., and No acute distress. Gait: Slow, steady gait.  Evaluation of the left side of his lower back demonstrates no bruising.  There is some yellow bruising proximal.  Some point tenderness in the left lower back.  Negative straight leg raise bilaterally.  He has good strength bilateral lower extremities.  Sensation is intact distally.  IMAGING: I personally ordered and reviewed the following images   New Medications:  No orders of the defined types were placed in this encounter.  Standing lumbar spine x-rays were obtained in clinic today.  No acute injuries are noted.  No anterolisthesis.  No bony lesions.  There is some loss of disc height.  Small anterior based osteophytes are appreciated at multiple levels.  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

## 2023-12-28 DIAGNOSIS — E1165 Type 2 diabetes mellitus with hyperglycemia: Secondary | ICD-10-CM | POA: Diagnosis not present

## 2023-12-28 DIAGNOSIS — E782 Mixed hyperlipidemia: Secondary | ICD-10-CM | POA: Diagnosis not present

## 2023-12-28 DIAGNOSIS — I10 Essential (primary) hypertension: Secondary | ICD-10-CM | POA: Diagnosis not present

## 2023-12-28 DIAGNOSIS — M545 Low back pain, unspecified: Secondary | ICD-10-CM | POA: Diagnosis not present

## 2024-01-10 DIAGNOSIS — I10 Essential (primary) hypertension: Secondary | ICD-10-CM | POA: Diagnosis not present

## 2024-01-10 DIAGNOSIS — E1165 Type 2 diabetes mellitus with hyperglycemia: Secondary | ICD-10-CM | POA: Diagnosis not present

## 2024-01-10 DIAGNOSIS — E782 Mixed hyperlipidemia: Secondary | ICD-10-CM | POA: Diagnosis not present

## 2024-02-17 ENCOUNTER — Encounter: Payer: Self-pay | Admitting: *Deleted

## 2024-02-17 NOTE — Progress Notes (Signed)
 Sean Mcfarland                                          MRN: [id number]   02/17/2024   The [facility] Quality Team Specialist reviewed this patient medical record for the purposes of chart review for care gap closure. The following were reviewed: chart review for care gap closure-controlling blood pressure.    [facility] Quality Team
